# Patient Record
Sex: Female | Born: 1990 | Race: White | Hispanic: No | Marital: Married | Smoking: Never smoker
Health system: Southern US, Community
[De-identification: ages and names within clinical notes are randomized; demographics above are authoritative.]

## PROBLEM LIST (undated history)

## (undated) ENCOUNTER — Inpatient Hospital Stay (HOSPITAL_COMMUNITY): Payer: Self-pay

## (undated) DIAGNOSIS — Z8759 Personal history of other complications of pregnancy, childbirth and the puerperium: Secondary | ICD-10-CM

## (undated) DIAGNOSIS — D649 Anemia, unspecified: Secondary | ICD-10-CM

## (undated) DIAGNOSIS — E282 Polycystic ovarian syndrome: Secondary | ICD-10-CM

## (undated) DIAGNOSIS — T7840XA Allergy, unspecified, initial encounter: Secondary | ICD-10-CM

## (undated) DIAGNOSIS — J45909 Unspecified asthma, uncomplicated: Secondary | ICD-10-CM

## (undated) DIAGNOSIS — R87629 Unspecified abnormal cytological findings in specimens from vagina: Secondary | ICD-10-CM

## (undated) DIAGNOSIS — K219 Gastro-esophageal reflux disease without esophagitis: Secondary | ICD-10-CM

## (undated) DIAGNOSIS — Z8619 Personal history of other infectious and parasitic diseases: Secondary | ICD-10-CM

## (undated) HISTORY — PX: EYE SURGERY: SHX253

## (undated) HISTORY — DX: Unspecified abnormal cytological findings in specimens from vagina: R87.629

## (undated) HISTORY — DX: Personal history of other complications of pregnancy, childbirth and the puerperium: Z87.59

## (undated) HISTORY — DX: Personal history of other infectious and parasitic diseases: Z86.19

## (undated) HISTORY — DX: Allergy, unspecified, initial encounter: T78.40XA

## (undated) HISTORY — DX: Gastro-esophageal reflux disease without esophagitis: K21.9

## (undated) HISTORY — PX: HYMENECTOMY: SHX987

## (undated) HISTORY — PX: CYSTECTOMY: SHX5119

## (undated) HISTORY — PX: SKIN TAG REMOVAL: SHX780

## (undated) HISTORY — DX: Unspecified asthma, uncomplicated: J45.909

## (undated) HISTORY — DX: Anemia, unspecified: D64.9

---
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
## 2009-08-13 ENCOUNTER — Ambulatory Visit: Payer: Self-pay | Admitting: Internal Medicine

## 2009-08-13 DIAGNOSIS — R030 Elevated blood-pressure reading, without diagnosis of hypertension: Secondary | ICD-10-CM

## 2009-08-13 DIAGNOSIS — R040 Epistaxis: Secondary | ICD-10-CM | POA: Insufficient documentation

## 2009-10-03 ENCOUNTER — Ambulatory Visit: Payer: Self-pay | Admitting: Family Medicine

## 2009-10-03 DIAGNOSIS — J069 Acute upper respiratory infection, unspecified: Secondary | ICD-10-CM | POA: Insufficient documentation

## 2009-10-03 LAB — CONVERTED CEMR LAB: Rapid Strep: NEGATIVE

## 2010-07-27 NOTE — Assessment & Plan Note (Signed)
Summary: elevated BP- jr   Vital Signs:  Patient profile:   20 year old female Height:      69 inches Weight:      234.50 pounds BMI:     34.75 O2 Sat:      97 % on Room air Temp:     98.2 degrees F Pulse rate:   83 / minute Pulse rhythm:   regular Resp:     18 per minute BP sitting:   100 / 64  (right arm) Cuff size:   large  Vitals Entered By: Glendell Docker CMA (August 13, 2009 1:05 PM)  O2 Flow:  Room air  Primary Care Provider:  Georgina Quint Plotnikov MD  CC:  Blood Pressure elevated.  History of Present Illness: 20 y/o white female with hx of asthma, obesity reports elevated blood pressure on Monday, one reading at [facility] 152/92  c/o headaches not sure if related to BP but right nose bleed  denies otc cold prep use / decongestants more stress at home /work  wt gain since last OV she eats one big meal per day   Preventive Screening-Counseling & Management  Alcohol-Tobacco     Smoking Status: never  Allergies (verified): No Known Drug Allergies  Past History:  Past Medical History: Asthma since age 48 Allergic rhinitis Obesity  Past Surgical History: Cyst removed from right elbow 2006 Hyman repair -  2008 Eye surgery for amblyopia - age 30    Family History: Family History Diabetes 1st degree relative Mother has history of Grave's, ADD   Social History: Research scientist (life sciences) at [employer]  Mother's boyfriend smokes Never Smoked Alcohol use-yes  Physical Exam  General:  alert, well-developed, and well-nourished.   Ears:  R ear normal and L ear normal.   Nose:  right nares -  Lungs:  normal respiratory effort and normal breath sounds.   Heart:  normal rate, regular rhythm, and no gallop.     Impression & Recommendations:  Problem # 1:  ELEVATED BLOOD PRESSURE WITHOUT DIAGNOSIS OF HYPERTENSION (ICD-796.2)  20 y/o took her BP at [facility] w automated cuff 152/92.  more stress at home / work.    bp today is normal.  pt to monitor BP at  home.  I stressed wt loss and low salt diet.  return to office if BP trends higher   BP today: 100/64 Prior BP: 130/78 (10/03/2007)  Labs Reviewed: Creat: 0.7 (08/24/2007) Chol: 172 (08/24/2007)   HDL: 43.0 (08/24/2007)   LDL: 109 (08/24/2007)   TG: 99 (08/24/2007)  Instructed in low sodium diet (DASH Handout) and behavior modification.    Problem # 2:  EPISTAXIS (ICD-784.7) right nose bleed.  right nare with evidence of recent bleeding.  use ayr gel and humidifier.  no aspirin or NSAID use I doubt nose bleed re:  transient BP elevation  Complete Medication List: 1)  Qvar 80 Mcg/act Aers (Beclomethasone dipropionate) .... 2 puffs bid 2)  Proair Hfa 108 (90 Base) Mcg/act Aers (Albuterol sulfate) .... 2 puffs q 4-6 hrs as needed 3)  Prilosec 20 Mg Cpdr (Omeprazole) .... One by mouth ac pm meal 4)  Loratadine 10 Mg Tabs (Loratadine) .Marland Kitchen.. 1 once daily as needed allergies  Patient Instructions: 1)  Monitor BP at home with large cuff 2)  Follow low salt diet. 3)  http://[email or web address]-calorie-counter.com/ 4)  Please schedule a follow-up appointment in 1 year. 5)  Please schedule a follow-up appointment as needed. 6)  Use Ayr gel as directed.  Current  Allergies (reviewed today): No known allergies    Preventive Care Screening  Pap Smear:    Date:  12/23/2008    Results:  normal    Preventive Care Screening  Pap Smear:    Date:  12/23/2008    Results:  normal

## 2010-07-27 NOTE — Assessment & Plan Note (Signed)
Summary: URI, early ROM   Vital Signs:  Patient profile:   20 year old female Height:      69 inches Weight:      233 pounds BMI:     34.53 O2 Sat:      96 % on Room air Temp:     100.4 degrees F oral Pulse rate:   96 / minute BP sitting:   104 / 70  (left arm) Cuff size:   large  Vitals Entered By: Payton Spark CMA (October 03, 2009 9:29 AM)  O2 Flow:  Room air CC: ST, earache, nasal congestion and cough x 2 days.    Primary Care Provider:  Georgina Quint Plotnikov MD  CC:  ST, earache, and nasal congestion and cough x 2 days. Marland Kitchen  History of Present Illness: ST, earache, nasal congestion and cough x 2 days. Low grade fever at home and her.  cough is dry and started this AM.  Taking a night time cold medicine.  No GI sxs.  no known sick contacts.  Right ear is hurting.  Green nasal mucous.  hx of asthma.  Says was told had fluid on her right ear about 3 weeks ago but was not having pain at that time.  Allergies: No Known Drug Allergies  Physical Exam  General:  Well-developed,well-nourished,in no acute distress; alert,appropriate and cooperative throughout examination Head:  Normocephalic and atraumatic without obvious abnormalities. No apparent alopecia or balding. Eyes:  No corneal or conjunctival inflammation noted. EOMI. Perrla.  Ears:  External ear exam shows no significant lesions or deformities.  LEft canal and TM are clear.  Right Tm is dull, erythematous and opaque. No discharge or fluid.  Nose:  External nasal examination shows no deformity or inflammation. Mouth:  Oral mucosa and oropharynx without lesions or exudates.  Teeth in good repair. Tongue is peirced.  Neck:  No deformities, masses, or tenderness noted. Lungs:  Normal respiratory effort, chest expands symmetrically. Lungs are clear to auscultation, no crackles or wheezes. Heart:  Normal rate and regular rhythm. S1 and S2 normal without gallop, murmur, click, rub or other extra sounds. Skin:  no rashes.     Cervical Nodes:  No lymphadenopathy noted Psych:  Cognition and judgment appear intact. Alert and cooperative with normal attention span and concentration. No apparent delusions, illusions, hallucinations   Impression & Recommendations:  Problem # 1:  URI (ICD-465.9)  Explained that she has a viral illness. Strep is negative. Also with her hx ofasthma/ allergies discussed the need to have an inhaler readily available. She says she is currently out of her rescue inhaler.  Her Right TM does look inflammed like an early OM. Reviewed wiht patient that if her ear pain worsens or fever persists to go ahead and fill rx for the augmentin. She preferred liquid.  If ear pain resolves then don't fill and expect 1-2 weeks for sxs to resolve. Pt agreed to care plan.  Her updated medication list for this problem includes:    Loratadine 10 Mg Tabs (Loratadine) .Marland Kitchen... 1 once daily as needed allergies  Orders: Rapid Strep (09323)  Problem # 2:  ALLERGIC RHINITIS (ICD-477.9) Encouraged her to strart an antihistamin as she reports that her ashtma and allergies do usually flare this time of year.  Reviewd the meds that are OTC and sent new rx for her inhaler.  Her updated medication list for this problem includes:    Loratadine 10 Mg Tabs (Loratadine) .Marland Kitchen... 1 once daily as needed  allergies  Complete Medication List: 1)  Qvar 80 Mcg/act Aers (Beclomethasone dipropionate) .... 2 puffs bid 2)  Proair Hfa 108 (90 Base) Mcg/act Aers (Albuterol sulfate) .... 2 puffs q 4-6 hrs as needed 3)  Prilosec 20 Mg Cpdr (Omeprazole) .... One by mouth ac pm meal 4)  Loratadine 10 Mg Tabs (Loratadine) .Marland Kitchen.. 1 once daily as needed allergies 5)  Augmentin 250-62.5 Mg/33ml Susr (Amoxicillin-pot clavulanate) .Marland Kitchen.. 10 ml by mouth two times a day for 10 days Prescriptions: AUGMENTIN 250-62.5 MG/5ML SUSR (AMOXICILLIN-POT CLAVULANATE) 10 ml by mouth two times a day for 10 days  #200 x 0   Entered and Authorized by:   Nani Gasser  MD   Signed by:   Nani Gasser MD on 10/03/2009   Method used:   Print then Give to Patient   RxID:   [id number] PROAIR HFA 108 (90 BASE) MCG/ACT  AERS (ALBUTEROL SULFATE) 2 puffs q 4-6 hrs as needed  #1 x 1   Entered and Authorized by:   Nani Gasser MD   Signed by:   Nani Gasser MD on 10/03/2009   Method used:   Electronically to        [facility]  [address] [id number] ([facility])       [address]       [place], [place]  [address]       Ph: [phone] or [phone]       Fax: [phone]   RxID:   [id number]   Laboratory Results    Other Tests  Rapid Strep: negative

## 2010-10-24 ENCOUNTER — Emergency Department (HOSPITAL_COMMUNITY)
Admission: EM | Admit: 2010-10-24 | Discharge: 2010-10-24 | Disposition: A | Discharge status: Home / Self Care | Payer: 59 | Attending: Emergency Medicine | Admitting: Emergency Medicine

## 2010-10-24 DIAGNOSIS — O99891 Other specified diseases and conditions complicating pregnancy: Secondary | ICD-10-CM | POA: Insufficient documentation

## 2010-10-24 DIAGNOSIS — X001XXA Exposure to smoke in uncontrolled fire in building or structure, initial encounter: Secondary | ICD-10-CM | POA: Insufficient documentation

## 2010-10-24 DIAGNOSIS — J705 Respiratory conditions due to smoke inhalation: Secondary | ICD-10-CM | POA: Insufficient documentation

## 2010-10-24 LAB — CARBOXYHEMOGLOBIN
Methemoglobin: 1.5 % (ref 0.0–1.5)
Total hemoglobin: 10.3 g/dL — ABNORMAL LOW (ref 12.5–16.0)

## 2010-11-12 NOTE — Op Note (Signed)
NAMESEDALIA, GREESON                 ACCOUNT NO.:  [id number]   MEDICAL RECORD NO.:  [id number]          PATIENT TYPE:  AMB   LOCATION:  SDC                           FACILITY:  [facility]   PHYSICIAN:  Michelle L. Grewal, M.D.DATE OF BIRTH:  1990-11-18   DATE OF PROCEDURE:  02/01/2005  DATE OF DISCHARGE:                                 OPERATIVE REPORT   PREOPERATIVE DIAGNOSIS:  Vaginal skin tags.   POSTOPERATIVE DIAGNOSIS:  Vaginal skin tags.   OPERATION/PROCEDURE:  Removal of vaginal skin tags.   SURGEON:  Michelle L. Vincente Poli, M.D.   ANESTHESIA:  General.   ESTIMATED BLOOD LOSS:  Minimal.   DESCRIPTION OF PROCEDURE:  The patient was taken to the operating room and  intubated without difficulty.  She was placed in the lithotomy position.  The vaginal opening is prepped and draped in the usual sterile fashion.  The  EUA revealed an intact hymenal ring and approximately 1.5 cm vaginal skin  tag, actually emanating from the 12 o'clock position on the hymen.  Local  was infiltrated at the base of the tag and it was removed easily with  scissors.  One single stitch using 4-0 Vicryl was used for hemostasis.  No  bleeding was noted.  The patient was extubated and returned to the recovery  room in stable condition.  All sponge, lap and instrument counts x2.       MLG/MEDQ  D:  02/01/2005  T:  02/01/2005  Job:  [id number]

## 2010-11-26 ENCOUNTER — Inpatient Hospital Stay (HOSPITAL_COMMUNITY)
Admission: AD | Admit: 2010-11-26 | Payer: Medicaid Other | Source: Ambulatory Visit | Admitting: Obstetrics and Gynecology

## 2010-12-03 ENCOUNTER — Inpatient Hospital Stay (HOSPITAL_COMMUNITY)
Admission: RE | Admit: 2010-12-03 | Discharge: 2010-12-05 | DRG: 775 | Disposition: A | Discharge status: Home / Self Care | Payer: 59 | Source: Ambulatory Visit | Attending: Obstetrics and Gynecology | Admitting: Obstetrics and Gynecology

## 2010-12-03 DIAGNOSIS — O139 Gestational [pregnancy-induced] hypertension without significant proteinuria, unspecified trimester: Principal | ICD-10-CM | POA: Diagnosis present

## 2010-12-03 LAB — CBC
Hemoglobin: 9.6 g/dL — ABNORMAL LOW (ref 12.0–15.0)
MCH: 25.6 pg — ABNORMAL LOW (ref 26.0–34.0)
MCV: 82.1 fL (ref 78.0–100.0)
RBC: 3.75 MIL/uL — ABNORMAL LOW (ref 3.87–5.11)

## 2010-12-03 LAB — COMPREHENSIVE METABOLIC PANEL
ALT: 8 U/L (ref 0–35)
CO2: 17 mEq/L — ABNORMAL LOW (ref 19–32)
Calcium: 9 mg/dL (ref 8.4–10.5)
Creatinine, Ser: 0.77 mg/dL (ref 0.4–1.2)
GFR calc Af Amer: >60 mL/min (ref >60)
GFR calc non Af Amer: >60 mL/min (ref >60)
Glucose, Bld: 106 mg/dL — ABNORMAL HIGH (ref 70–99)

## 2010-12-03 LAB — RPR: RPR Ser Ql: NONREACTIVE

## 2010-12-04 LAB — CBC
MCHC: 31.7 g/dL (ref 30.0–36.0)
MCV: 81.1 fL (ref 78.0–100.0)
Platelets: 194 10*3/uL (ref 150–400)
RDW: 15.7 % — ABNORMAL HIGH (ref 11.5–15.5)
WBC: 14 10*3/uL — ABNORMAL HIGH (ref 4.0–10.5)

## 2010-12-24 NOTE — Op Note (Signed)
  [name], Kelly Ellison                 ACCOUNT NO.:  [id number]  MEDICAL RECORD NO.:  [id number]  LOCATION:  9108                          FACILITY:  [facility]  PHYSICIAN:  Guy Sandifer. Henderson Cloud, M.D. DATE OF BIRTH:  08/18/1990  DATE OF PROCEDURE:  12/03/2010 DATE OF DISCHARGE:                              OPERATIVE REPORT   PROCEDURE:  Vaginal delivery.  PROCEDURE:  This patient is a 20 year old, G1, P0 with an EDC of December 03, 2010, who is admitted with elevated blood pressures.  She has no central nervous system changes, no epigastric pain.  Laboratories are normal. She progresses to complete and pushing.  She has approximately a 30- minute second stage.  There is spontaneous delivery of the vertex. There is a mild shoulder dystocia lasting less than 1 minute.  A second- degree midline episiotomy that had already been performed.  The McRoberts position and suprapubic pressure were used.  With only a moderate traction, the anterior shoulder was delivered without difficulty.  The remainder of the baby delivers without difficulty. Viable female infant, Apgars of 9 and nine at 1 and five minutes respectively was obtained.  Birth weight was 10 pounds 1 ounce. Arterial cord pH is pending.  Placenta is 3-vessels intact.  Estimated blood loss is 500 mL.  The second-degree midline episiotomy is repaired with 2-0 repeat.  Rectum was checked and okay.  Cervix and vagina are intact.  The patient and infant are stable in the labor and delivery room.     Guy Sandifer Henderson Cloud, M.D.     JET/MEDQ  D:  12/03/2010  T:  12/04/2010  Job:  [id number]  Electronically Signed by Harold Hedge M.D. on 12/24/2010 09:14:44 AM

## 2011-01-03 LAB — HM PAP SMEAR: HM Pap smear: NORMAL

## 2011-03-30 LAB — POCT URINALYSIS DIP (DEVICE)
Ketones, ur: NEGATIVE
Protein, ur: 100 — AB
Specific Gravity, Urine: 1.02
pH: 7

## 2011-12-22 ENCOUNTER — Encounter: Payer: Self-pay | Admitting: Internal Medicine

## 2011-12-22 ENCOUNTER — Ambulatory Visit (INDEPENDENT_AMBULATORY_CARE_PROVIDER_SITE_OTHER): Payer: 59 | Admitting: Internal Medicine

## 2011-12-22 VITALS — BP 100/74 | HR 68 | Temp 97.9°F | Resp 18 | Ht 67.5 in | Wt 266.0 lb

## 2011-12-22 DIAGNOSIS — B001 Herpesviral vesicular dermatitis: Secondary | ICD-10-CM | POA: Insufficient documentation

## 2011-12-22 DIAGNOSIS — B009 Herpesviral infection, unspecified: Secondary | ICD-10-CM

## 2011-12-22 MED ORDER — VALACYCLOVIR HCL 1 G PO TABS: ORAL_TABLET | ORAL | Status: AC

## 2011-12-22 NOTE — Progress Notes (Signed)
  Subjective:    Patient ID: Kelly Ellison, female    DOB: [birth year], 21 y.o.   MRN: [id number]  HPI Pt presents to clinic for evaluation of fever blisters. Notes 5d h/o two cold sores located right and right of midline lower lip. Has crusting and some discomfort worse with eating. Attempted otc cream without improvement. No other alleviating or exacerbating factors.   Past Medical History  Diagnosis Date  . Asthma     inhaler prn  . History of chicken pox     age 64   Past Surgical History  Procedure Date  . Cystectomy     left elbow  . Eye surgery     young age  . Skin tag removal     vaginal    reports that she has never smoked. She has never used smokeless tobacco. She reports that she does not drink alcohol or use illicit drugs. family history includes Arthritis in her mother and Diabetes in her maternal grandmother.  There is no history of Breast cancer, and Prostate cancer, and Colon cancer, and Heart disease, and Hypertension, . No Known Allergies   Review of Systems see hpi     Objective:   Physical Exam  Nursing note and vitals reviewed. Constitutional: She appears well-developed and well-nourished. No distress.  HENT:  Head: Normocephalic and atraumatic.  Right Ear: External ear normal.  Left Ear: External ear normal.  Eyes: Conjunctivae are normal. No scleral icterus.  Neck: Neck supple.  Lymphadenopathy:    She has no cervical adenopathy.  Skin: Skin is warm and dry. She is not diaphoretic.       Right lower lip with yellow crusting lesion. OP and mucosal membranes clear.  Psychiatric: She has a normal mood and affect.          Assessment & Plan:

## 2011-12-22 NOTE — Assessment & Plan Note (Signed)
Attempt valtrex prn. Followup if no improvement or worsening.

## 2012-02-28 ENCOUNTER — Other Ambulatory Visit: Payer: Self-pay | Admitting: Obstetrics and Gynecology

## 2013-04-10 ENCOUNTER — Other Ambulatory Visit: Payer: Self-pay | Admitting: Obstetrics and Gynecology

## 2014-05-16 ENCOUNTER — Other Ambulatory Visit: Payer: Self-pay | Admitting: Obstetrics and Gynecology

## 2014-05-20 LAB — CYTOLOGY - PAP

## 2014-10-06 ENCOUNTER — Encounter (HOSPITAL_COMMUNITY): Payer: Self-pay | Admitting: Emergency Medicine

## 2014-10-06 ENCOUNTER — Emergency Department (HOSPITAL_COMMUNITY)
Admission: EM | Admit: 2014-10-06 | Discharge: 2014-10-06 | Disposition: A | Discharge status: Home / Self Care | Payer: 59 | Attending: Emergency Medicine | Admitting: Emergency Medicine

## 2014-10-06 DIAGNOSIS — Z8619 Personal history of other infectious and parasitic diseases: Secondary | ICD-10-CM | POA: Insufficient documentation

## 2014-10-06 DIAGNOSIS — Z79899 Other long term (current) drug therapy: Secondary | ICD-10-CM | POA: Diagnosis not present

## 2014-10-06 DIAGNOSIS — J302 Other seasonal allergic rhinitis: Secondary | ICD-10-CM

## 2014-10-06 DIAGNOSIS — J45901 Unspecified asthma with (acute) exacerbation: Secondary | ICD-10-CM | POA: Insufficient documentation

## 2014-10-06 DIAGNOSIS — J45909 Unspecified asthma, uncomplicated: Secondary | ICD-10-CM | POA: Diagnosis present

## 2014-10-06 MED ORDER — PREDNISONE 10 MG PO TABS: 20.0000 mg | ORAL_TABLET | Freq: Every day | ORAL | Status: DC

## 2014-10-06 MED ORDER — PREDNISONE 20 MG PO TABS
60.0000 mg | ORAL_TABLET | Freq: Once | ORAL | Status: AC
  Administered 2014-10-06: 60 mg via ORAL
  Filled 2014-10-06: qty 3

## 2014-10-06 MED ORDER — IPRATROPIUM-ALBUTEROL 0.5-2.5 (3) MG/3ML IN SOLN
3.0000 mL | Freq: Once | RESPIRATORY_TRACT | Status: AC
  Administered 2014-10-06: 3 mL via RESPIRATORY_TRACT
  Filled 2014-10-06: qty 3

## 2014-10-06 NOTE — ED Notes (Signed)
Pt c/o asthma x 1 week. Has been using inhaler without relief. Mild wheezing noted to L upper lobe

## 2014-10-06 NOTE — ED Provider Notes (Signed)
CSN: [id number]     Arrival date & time 10/06/14  2102 History  This chart was scribed for Marlon Pel, PA-C, working with Blake Divine, MD by Elon Spanner, ED Scribe. This patient was seen in room TR03C/TR03C and the patient's care was started at 9:24 PM.   Chief Complaint  Patient presents with  . Asthma    The patient says her asthma began acting up for about a week. She said she tried her inhaler and it is not working.  She is here to be evaluated.   The history is provided by the patient. No language interpreter was used.   PCP: Letitia Libra, Ala Dach, MD Blood pressure 122/68, pulse 99, temperature 98.2 F (36.8 C), temperature source Oral, resp. rate 16, last menstrual period 10/04/2014, SpO2 100 %.  Kelly Ellison is a 24 y.o.female with a significant PMH of asthma and chicken pox.    HPI Comments: Kelly Ellison is a 24 y.o. female with a history of asthma who presents to the Emergency Department complaining of wheezing with associated cough, throat and chest tightness, and congestion onset 1 week ago with worsening 3 days ago.  She has used her albuterol inhaler with minor, transient relief as well as Mucinex DM, and allegra without relief.  She also notes some mild, intermittent ear pain that resolved 3 days ago.  Patient does not have an at-home nebulizer.  Patient denies sneezing, fever, nausea, vomiting, ear pain, leg swelling, abdominal pain.  Her mom is a Engineer, civil (consulting) here at [facility], they deny that she has had any URI symptoms.  Past Medical History  Diagnosis Date  . Asthma     inhaler prn  . History of chicken pox     age 38   Past Surgical History  Procedure Laterality Date  . Cystectomy      left elbow  . Eye surgery      young age  . Skin tag removal      vaginal   Family History  Problem Relation Age of Onset  . Arthritis Mother     maternal grandmother  . Breast cancer Neg Hx   . Prostate cancer Neg Hx   . Colon cancer Neg Hx   . Heart disease Neg Hx    . Diabetes Maternal Grandmother     brohter  . Hypertension Neg Hx    History  Substance Use Topics  . Smoking status: Never Smoker   . Smokeless tobacco: Never Used  . Alcohol Use: No   OB History    No data available     Review of Systems  Constitutional: Negative for fever.  HENT: Negative for ear pain and sneezing.   Respiratory: Positive for chest tightness and wheezing.   Cardiovascular: Negative for leg swelling.  Gastrointestinal: Negative for nausea, vomiting and abdominal pain.    Allergies  Review of patient's allergies indicates no known allergies.  Home Medications   Prior to Admission medications   Medication Sig Start Date End Date Taking? Authorizing Provider  albuterol (PROAIR HFA) 108 (90 BASE) MCG/ACT inhaler Inhale 2 puffs into the lungs every 6 (six) hours as needed.    Historical Provider, MD  predniSONE (DELTASONE) 10 MG tablet Take 2 tablets (20 mg total) by mouth daily. 10/06/14   Averey Trompeter Neva Seat, PA-C   BP 122/68 mmHg  Pulse 99  Temp(Src) 98.2 F (36.8 C) (Oral)  Resp 16  SpO2 100%  LMP 10/04/2014 Physical Exam  Constitutional: She is oriented  to person, place, and time. She appears well-developed and well-nourished. No distress.  HENT:  Head: Normocephalic and atraumatic.  Eyes: Conjunctivae and EOM are normal.  Neck: Neck supple. No tracheal deviation present.  Cardiovascular: Normal rate.   Pulmonary/Chest: Effort normal. No respiratory distress. She has no decreased breath sounds. She has wheezes (very minimal expiratory wheezes). She has no rhonchi. She has no rales.  Musculoskeletal: Normal range of motion.  Neurological: She is alert and oriented to person, place, and time.  Skin: Skin is warm and dry.  Psychiatric: She has a normal mood and affect. Her behavior is normal.  Nursing note and vitals reviewed.   ED Course  Procedures (including critical care time)  DIAGNOSTIC STUDIES: Oxygen Saturation is 98% on RA, normal by my  interpretation.    COORDINATION OF CARE:  9:29 PM Discussed treatment plan with patient at bedside.  Patient acknowledges and agrees with plan.    Labs Review Labs Reviewed - No data to display  Imaging Review No results found.   EKG Interpretation None      MDM   Final diagnoses:  Asthma exacerbation  Seasonal allergies    Medications  ipratropium-albuterol (DUONEB) 0.5-2.5 (3) MG/3ML nebulizer solution 3 mL (3 mLs Nebulization Given 10/06/14 2121)  predniSONE (DELTASONE) tablet 60 mg (60 mg Oral Given 10/06/14 2136)  ipratropium-albuterol (DUONEB) 0.5-2.5 (3) MG/3ML nebulizer solution 3 mL (3 mLs Nebulization Given 10/06/14 2203)   The patient is to be started on Prednisone for albuterol resistant asthma exacerbation. Her symptoms are very mild and she is sating on oxygen at 100% on room air She is not tachycardic or retracting. Pt will appearing.  24 y.o.Kelly Ellison's evaluation in the Emergency Department is complete. It has been determined that no acute conditions requiring further emergency intervention are present at this time. The patient/guardian have been advised of the diagnosis and plan. We have discussed signs and symptoms that warrant return to the ED, such as changes or worsening in symptoms.  Vital signs are stable at discharge. Filed Vitals:   10/06/14 2220  BP: 122/68  Pulse: 99  Temp:   Resp: 16    Patient/guardian has voiced understanding and agreed to follow-up with the PCP or specialist.   I personally performed the services described in this documentation, which was scribed in my presence. The recorded information has been reviewed and is accurate.    Marlon Peliffany Makaylin Carlo, PA-C 10/07/14 1402  Blake DivineJohn Wofford, MD 10/07/14 [phone]

## 2014-10-06 NOTE — Discharge Instructions (Signed)
Bronchospasm A bronchospasm is a spasm or tightening of the airways going into the lungs. During a bronchospasm breathing becomes more difficult because the airways get smaller. When this happens there can be coughing, a whistling sound when breathing (wheezing), and difficulty breathing. Bronchospasm is often associated with asthma, but not all patients who experience a bronchospasm have asthma. CAUSES  A bronchospasm is caused by inflammation or irritation of the airways. The inflammation or irritation may be triggered by:   Allergies (such as to animals, pollen, food, or mold). Allergens that cause bronchospasm may cause wheezing immediately after exposure or many hours later.   Infection. Viral infections are believed to be the most common cause of bronchospasm.   Exercise.   Irritants (such as pollution, cigarette smoke, strong odors, aerosol sprays, and paint fumes).   Weather changes. Winds increase molds and pollens in the air. Rain refreshes the air by washing irritants out. Cold air may cause inflammation.   Stress and emotional upset.  SIGNS AND SYMPTOMS   Wheezing.   Excessive nighttime coughing.   Frequent or severe coughing with a simple cold.   Chest tightness.   Shortness of breath.  DIAGNOSIS  Bronchospasm is usually diagnosed through a history and physical exam. Tests, such as chest X-rays, are sometimes done to look for other conditions. TREATMENT   Inhaled medicines can be given to open up your airways and help you breathe. The medicines can be given using either an inhaler or a nebulizer machine.  Corticosteroid medicines may be given for severe bronchospasm, usually when it is associated with asthma. HOME CARE INSTRUCTIONS   Always have a plan prepared for seeking medical care. Know when to call your health care provider and local emergency services (911 in the [place] Know where you can access local emergency care.  Only take medicines as  directed by your health care provider.  If you were prescribed an inhaler or nebulizer machine, ask your health care provider to explain how to use it correctly. Always use a spacer with your inhaler if you were given one.  It is necessary to remain calm during an attack. Try to relax and breathe more slowly.  Control your home environment in the following ways:   Change your heating and air conditioning filter at least once a month.   Limit your use of fireplaces and wood stoves.  Do not smoke and do not allow smoking in your home.   Avoid exposure to perfumes and fragrances.   Get rid of pests (such as roaches and mice) and their droppings.   Throw away plants if you see mold on them.   Keep your house clean and dust free.   Replace carpet with wood, tile, or vinyl flooring. Carpet can trap dander and dust.   Use allergy-proof pillows, mattress covers, and box spring covers.   Wash bed sheets and blankets every week in hot water and dry them in a dryer.   Use blankets that are made of polyester or cotton.   Wash hands frequently. SEEK MEDICAL CARE IF:   You have muscle aches.   You have chest pain.   The sputum changes from clear or white to yellow, green, gray, or bloody.   The sputum you cough up gets thicker.   There are problems that may be related to the medicine you are given, such as a rash, itching, swelling, or trouble breathing.  SEEK IMMEDIATE MEDICAL CARE IF:   You have worsening wheezing and coughing even  after taking your prescribed medicines.   You have increased difficulty breathing.   You develop severe chest pain. MAKE SURE YOU:   Understand these instructions.  Will watch your condition.  Will get help right away if you are not doing well or get worse. Document Released: 06/16/2003 Document Revised: 06/18/2013 Document Reviewed: 12/03/2012 [facility] Patient Information 2015 [place], [place]. This information is not  intended to replace advice given to you by your health care provider. Make sure you discuss any questions you have with your health care provider.  Asthma Asthma is a recurring condition in which the airways tighten and narrow. Asthma can make it difficult to breathe. It can cause coughing, wheezing, and shortness of breath. Asthma episodes, also called asthma attacks, range from minor to life-threatening. Asthma cannot be cured, but medicines and lifestyle changes can help control it. CAUSES Asthma is believed to be caused by inherited (genetic) and environmental factors, but its exact cause is unknown. Asthma may be triggered by allergens, lung infections, or irritants in the air. Asthma triggers are different for each person. Common triggers include:   Animal dander.  Dust mites.  Cockroaches.  Pollen from trees or grass.  Mold.  Smoke.  Air pollutants such as dust, household cleaners, hair sprays, aerosol sprays, paint fumes, strong chemicals, or strong odors.  Cold air, weather changes, and winds (which increase molds and pollens in the air).  Strong emotional expressions such as crying or laughing hard.  Stress.  Certain medicines (such as aspirin) or types of drugs (such as beta-blockers).  Sulfites in foods and drinks. Foods and drinks that may contain sulfites include dried fruit, potato chips, and sparkling grape juice.  Infections or inflammatory conditions such as the flu, a cold, or an inflammation of the nasal membranes (rhinitis).  Gastroesophageal reflux disease (GERD).  Exercise or strenuous activity. SYMPTOMS Symptoms may occur immediately after asthma is triggered or many hours later. Symptoms include:  Wheezing.  Excessive nighttime or early morning coughing.  Frequent or severe coughing with a common cold.  Chest tightness.  Shortness of breath. DIAGNOSIS  The diagnosis of asthma is made by a review of your medical history and a physical exam.  Tests may also be performed. These may include:  Lung function studies. These tests show how much air you breathe in and out.  Allergy tests.  Imaging tests such as X-rays. TREATMENT  Asthma cannot be cured, but it can usually be controlled. Treatment involves identifying and avoiding your asthma triggers. It also involves medicines. There are 2 classes of medicine used for asthma treatment:   Controller medicines. These prevent asthma symptoms from occurring. They are usually taken every day.  Reliever or rescue medicines. These quickly relieve asthma symptoms. They are used as needed and provide short-term relief. Your health care provider will help you create an asthma action plan. An asthma action plan is a written plan for managing and treating your asthma attacks. It includes a list of your asthma triggers and how they may be avoided. It also includes information on when medicines should be taken and when their dosage should be changed. An action plan may also involve the use of a device called a peak flow meter. A peak flow meter measures how well the lungs are working. It helps you monitor your condition. HOME CARE INSTRUCTIONS   Take medicines only as directed by your health care provider. Speak with your health care provider if you have questions about how or when to take  the medicines.  Use a peak flow meter as directed by your health care provider. Record and keep track of readings.  Understand and use the action plan to help minimize or stop an asthma attack without needing to seek medical care.  Control your home environment in the following ways to help prevent asthma attacks:  Do not smoke. Avoid being exposed to secondhand smoke.  Change your heating and air conditioning filter regularly.  Limit your use of fireplaces and wood stoves.  Get rid of pests (such as roaches and mice) and their droppings.  Throw away plants if you see mold on them.  Clean your floors and  dust regularly. Use unscented cleaning products.  Try to have someone else vacuum for you regularly. Stay out of rooms while they are being vacuumed and for a short while afterward. If you vacuum, use a dust mask from a hardware store, a double-layered or microfilter vacuum cleaner bag, or a vacuum cleaner with a HEPA filter.  Replace carpet with wood, tile, or vinyl flooring. Carpet can trap dander and dust.  Use allergy-proof pillows, mattress covers, and box spring covers.  Wash bed sheets and blankets every week in hot water and dry them in a dryer.  Use blankets that are made of polyester or cotton.  Clean bathrooms and kitchens with bleach. If possible, have someone repaint the walls in these rooms with mold-resistant paint. Keep out of the rooms that are being cleaned and painted.  Wash hands frequently. SEEK MEDICAL CARE IF:   You have wheezing, shortness of breath, or a cough even if taking medicine to prevent attacks.  The colored mucus you cough up (sputum) is thicker than usual.  Your sputum changes from clear or white to yellow, green, gray, or bloody.  You have any problems that may be related to the medicines you are taking (such as a rash, itching, swelling, or trouble breathing).  You are using a reliever medicine more than 2-3 times per week.  Your peak flow is still at 50-79% of your personal best after following your action plan for 1 hour.  You have a fever. SEEK IMMEDIATE MEDICAL CARE IF:   You seem to be getting worse and are unresponsive to treatment during an asthma attack.  You are short of breath even at rest.  You get short of breath when doing very little physical activity.  You have difficulty eating, drinking, or talking due to asthma symptoms.  You develop chest pain.  You develop a fast heartbeat.  You have a bluish color to your lips or fingernails.  You are light-headed, dizzy, or faint.  Your peak flow is less than 50% of your  personal best. MAKE SURE YOU:   Understand these instructions.  Will watch your condition.  Will get help right away if you are not doing well or get worse. Document Released: 06/13/2005 Document Revised: 10/28/2013 Document Reviewed: 01/10/2013 [facility] Patient Information 2015 [place], [place]. This information is not intended to replace advice given to you by your health care provider. Make sure you discuss any questions you have with your health care provider.  Allergies Allergies may happen from anything your body is sensitive to. This may be food, medicines, pollens, chemicals, and nearly anything around you in everyday life that produces allergens. An allergen is anything that causes an allergy producing substance. Heredity is often a factor in causing these problems. This means you may have some of the same allergies as your parents. Food allergies  happen in all age groups. Food allergies are some of the most severe and life threatening. Some common food allergies are cow's milk, seafood, eggs, nuts, wheat, and soybeans. SYMPTOMS   Swelling around the mouth.  An itchy red rash or hives.  Vomiting or diarrhea.  Difficulty breathing. SEVERE ALLERGIC REACTIONS ARE LIFE-THREATENING. This reaction is called anaphylaxis. It can cause the mouth and throat to swell and cause difficulty with breathing and swallowing. In severe reactions only a trace amount of food (for example, peanut oil in a salad) may cause death within seconds. Seasonal allergies occur in all age groups. These are seasonal because they usually occur during the same season every year. They may be a reaction to molds, grass pollens, or tree pollens. Other causes of problems are house dust mite allergens, pet dander, and mold spores. The symptoms often consist of nasal congestion, a runny itchy nose associated with sneezing, and tearing itchy eyes. There is often an associated itching of the mouth and ears. The problems  happen when you come in contact with pollens and other allergens. Allergens are the particles in the air that the body reacts to with an allergic reaction. This causes you to release allergic antibodies. Through a chain of events, these eventually cause you to release histamine into the blood stream. Although it is meant to be protective to the body, it is this release that causes your discomfort. This is why you were given anti-histamines to feel better. If you are unable to pinpoint the offending allergen, it may be determined by skin or blood testing. Allergies cannot be cured but can be controlled with medicine. Hay fever is a collection of all or some of the seasonal allergy problems. It may often be treated with simple over-the-counter medicine such as diphenhydramine. Take medicine as directed. Do not drink alcohol or drive while taking this medicine. Check with your caregiver or package insert for child dosages. If these medicines are not effective, there are many new medicines your caregiver can prescribe. Stronger medicine such as nasal spray, eye drops, and corticosteroids may be used if the first things you try do not work well. Other treatments such as immunotherapy or desensitizing injections can be used if all else fails. Follow up with your caregiver if problems continue. These seasonal allergies are usually not life threatening. They are generally more of a nuisance that can often be handled using medicine. HOME CARE INSTRUCTIONS   If unsure what causes a reaction, keep a diary of foods eaten and symptoms that follow. Avoid foods that cause reactions.  If hives or rash are present:  Take medicine as directed.  You may use an over-the-counter antihistamine (diphenhydramine) for hives and itching as needed.  Apply cold compresses (cloths) to the skin or take baths in cool water. Avoid hot baths or showers. Heat will make a rash and itching worse.  If you are severely  allergic:  Following a treatment for a severe reaction, hospitalization is often required for closer follow-up.  Wear a medic-alert bracelet or necklace stating the allergy.  You and your family must learn how to give adrenaline or use an anaphylaxis kit.  If you have had a severe reaction, always carry your anaphylaxis kit or EpiPen with you. Use this medicine as directed by your caregiver if a severe reaction is occurring. Failure to do so could have a fatal outcome. SEEK MEDICAL CARE IF:  You suspect a food allergy. Symptoms generally happen within 30 minutes of eating a  food.  Your symptoms have not gone away within 2 days or are getting worse.  You develop new symptoms.  You want to retest yourself or your child with a food or drink you think causes an allergic reaction. Never do this if an anaphylactic reaction to that food or drink has happened before. Only do this under the care of a caregiver. SEEK IMMEDIATE MEDICAL CARE IF:   You have difficulty breathing, are wheezing, or have a tight feeling in your chest or throat.  You have a swollen mouth, or you have hives, swelling, or itching all over your body.  You have had a severe reaction that has responded to your anaphylaxis kit or an EpiPen. These reactions may return when the medicine has worn off. These reactions should be considered life threatening. MAKE SURE YOU:   Understand these instructions.  Will watch your condition.  Will get help right away if you are not doing well or get worse. Document Released: 09/06/2002 Document Revised: 10/08/2012 Document Reviewed: 02/11/2008 [facility] Patient Information 2015 [place], [place]. This information is not intended to replace advice given to you by your health care provider. Make sure you discuss any questions you have with your health care provider.

## 2014-10-06 NOTE — ED Notes (Signed)
The patient says her asthma began acting up for about a week. She said she tried her inhaler and it is not working.  She is here to be evaluated.

## 2014-10-08 ENCOUNTER — Encounter: Payer: Self-pay | Admitting: Medical

## 2014-10-08 ENCOUNTER — Ambulatory Visit (INDEPENDENT_AMBULATORY_CARE_PROVIDER_SITE_OTHER): Payer: 59 | Admitting: Medical

## 2014-10-08 VITALS — BP 122/82 | HR 74 | Temp 98.2°F | Ht 68.75 in | Wt 284.8 lb

## 2014-10-08 DIAGNOSIS — J45901 Unspecified asthma with (acute) exacerbation: Secondary | ICD-10-CM

## 2014-10-08 DIAGNOSIS — J4521 Mild intermittent asthma with (acute) exacerbation: Secondary | ICD-10-CM

## 2014-10-08 DIAGNOSIS — E282 Polycystic ovarian syndrome: Secondary | ICD-10-CM

## 2014-10-08 HISTORY — DX: Unspecified asthma with (acute) exacerbation: J45.901

## 2014-10-08 MED ORDER — ALBUTEROL SULFATE HFA 108 (90 BASE) MCG/ACT IN AERS
2.0000 | INHALATION_SPRAY | Freq: Four times a day (QID) | RESPIRATORY_TRACT | Status: DC | PRN
Start: 2014-10-08 — End: 2022-10-13

## 2014-10-08 MED ORDER — ALBUTEROL SULFATE (2.5 MG/3ML) 0.083% IN NEBU: 2.5000 mg | INHALATION_SOLUTION | Freq: Four times a day (QID) | RESPIRATORY_TRACT | Status: DC | PRN

## 2014-10-08 MED ORDER — BECLOMETHASONE DIPROPIONATE 40 MCG/ACT IN AERS: 2.0000 | INHALATION_SPRAY | Freq: Two times a day (BID) | RESPIRATORY_TRACT | Status: DC

## 2014-10-08 NOTE — Progress Notes (Signed)
Pre visit review using our clinic review tool, if applicable. No additional management support is needed unless otherwise documented below in the visit note. 

## 2014-10-08 NOTE — Assessment & Plan Note (Signed)
Likely allergic rhinitis component since mid spring with asthma flare. So continue allegra and get flonase otc.

## 2014-10-08 NOTE — Assessment & Plan Note (Signed)
On metformin. And on progesterone as well per gyn instructions.Dr. Vincente PoliGrewal at [facility].

## 2014-10-08 NOTE — Assessment & Plan Note (Signed)
Pt flare now at mild level. Worse other day in ED. 2 yrs without any significant flare. Rx of qvar. Continue the taper predninsone. Rx albuterol inhaler. And neb machine with solution. Solution sent to her pharmacy. Neb machine rx sent to [facility].

## 2014-10-08 NOTE — Patient Instructions (Addendum)
ALLERGIC RHINITIS Likely allergic rhinitis component since mid spring with asthma flare. So continue allegra and get flonase otc.   Asthma with acute exacerbation Pt flare now at mild level. Worse other day in ED. 2 yrs without any significant flare. Rx of qvar. Continue the taper predninsone. Rx albuterol inhaler. And neb machine with solution. Solution sent to her pharmacy. Neb machine rx sent to [facility].     Follow up in 3 wks or as needed.

## 2014-10-08 NOTE — Progress Notes (Signed)
Subjective:    Patient ID: Kelly Ellison, female    DOB: 08/09/1990, 24 y.o.   MRN: [id number]  HPI  I have reviewed pt PMH, PSH, FH, Social History and Surgical History  Allergies- seasonal allergies per pt. Thought she does not describe hx. But ED thought recent asthma flare caused by allergies.   Asthma- since age of 24 yo. Last 2 years she had very minimal symptoms. So rare past 2 years only would borrow family members inhaler.  About one week ago got wheezy. She was using family members albuterol every 4 hours or sooner but it was not helping. She was coughing and sob. So went to ED.  Pt has not filled prednisone yet. She has prednsione at home and following tapered dose 60 mg 1 days, 50 mg 1 day, 40 mg 1day, 30 mg 1 day, 20 mg 1 day, and 10 mg 1 day. Pt feels some improved.   Pt was not given antibiotic other day.   LMP- started on Saturday. Still on.  Review of Systems  Constitutional: Negative for fever, chills and fatigue.  HENT: Positive for congestion. Negative for postnasal drip, rhinorrhea, sinus pressure, sneezing, sore throat and trouble swallowing.   Eyes: Negative for redness and itching.  Respiratory: Positive for cough and wheezing. Negative for chest tightness and shortness of breath.        Much better than other day per pt.  Cardiovascular: Negative for chest pain and palpitations.  Musculoskeletal: Negative for back pain.  Neurological: Negative for dizziness and headaches.  Hematological: Negative for adenopathy. Does not bruise/bleed easily.   Past Medical History  Diagnosis Date  . Asthma     inhaler prn  . History of chicken pox     age 89  . Allergy   . Anemia     History   Social History  . Marital Status: Married    Spouse Name: N/A  . Number of Children: N/A  . Years of Education: N/A   Occupational History  . Not on file.   Social History Main Topics  . Smoking status: Never Smoker   . Smokeless tobacco: Never Used  . Alcohol Use:  No  . Drug Use: No  . Sexual Activity: Not on file   Other Topics Concern  . Not on file   Social History Narrative    Past Surgical History  Procedure Laterality Date  . Cystectomy      left elbow  . Eye surgery      young age  . Skin tag removal      vaginal    Family History  Problem Relation Age of Onset  . Arthritis Mother     maternal grandmother  . Breast cancer Neg Hx   . Prostate cancer Neg Hx   . Colon cancer Neg Hx   . Heart disease Neg Hx   . Diabetes Maternal Grandmother     brohter  . Hypertension Neg Hx     No Known Allergies  Current Outpatient Prescriptions on File Prior to Visit  Medication Sig Dispense Refill  . predniSONE (DELTASONE) 10 MG tablet Take 2 tablets (20 mg total) by mouth daily. 21 tablet 0   No current facility-administered medications on file prior to visit.    BP 122/82 mmHg  Pulse 74  Temp(Src) 98.2 F (36.8 C) (Oral)  Ht 5' 8.75" (1.746 m)  Wt 284 lb 12.8 oz (129.184 kg)  BMI 42.38 kg/m2  SpO2 100%  LMP  10/04/2014       Objective:   Physical Exam   General  Mental Status - Alert. General Appearance - Well groomed. Not in acute distress.  Skin Rashes- No Rashes.  HEENT Head- Normal. Ear Auditory Canal - Left- Normal. Right - Normal.Tympanic Membrane- Left- Normal. Right- Normal. Eye Sclera/Conjunctiva- Left- Normal. Right- Normal. Nose & Sinuses Nasal Mucosa- Left-  Boggy and Congested. Right-  Boggy and  Congested.No maxillary or  frontal sinus pressure. Mouth & Throat Lips: Upper Lip- Normal: no dryness, cracking, pallor, cyanosis, or vesicular eruption. Lower Lip-Normal: no dryness, cracking, pallor, cyanosis or vesicular eruption. Buccal Mucosa- Bilateral- No Aphthous ulcers. Oropharynx- No Discharge or Erythema. +pnd Tonsils: Characteristics- Bilateral- No Erythema or Congestion. Size/Enlargement- Bilateral- No enlargement. Discharge- bilateral-None.  Neck Neck- Supple. No Masses. No tracheal  deviation.   Chest and Lung Exam Auscultation: Breath Sounds:-Clear even and unlabored. Looks stable. Cardiovascular Auscultation:Rythm- Regular, rate and rhythm. Murmurs & Other Heart Sounds:Ausculatation of the heart reveal- No Murmurs.  Lymphatic Head & Neck General Head & Neck Lymphatics: Bilateral: Description- No Localized lymphadenopathy.      Assessment & Plan:

## 2015-04-09 ENCOUNTER — Encounter: Payer: Self-pay | Admitting: Medical

## 2015-04-09 ENCOUNTER — Ambulatory Visit (INDEPENDENT_AMBULATORY_CARE_PROVIDER_SITE_OTHER): Payer: 59 | Admitting: Medical

## 2015-04-09 VITALS — BP 122/80 | HR 100 | Temp 97.8°F | Ht 68.75 in | Wt 288.0 lb

## 2015-04-09 DIAGNOSIS — J0101 Acute recurrent maxillary sinusitis: Secondary | ICD-10-CM | POA: Diagnosis not present

## 2015-04-09 DIAGNOSIS — H6502 Acute serous otitis media, left ear: Secondary | ICD-10-CM

## 2015-04-09 MED ORDER — AMOXICILLIN-POT CLAVULANATE 875-125 MG PO TABS: 1.0000 | ORAL_TABLET | Freq: Two times a day (BID) | ORAL | Status: DC

## 2015-04-09 MED ORDER — FLUTICASONE PROPIONATE 50 MCG/ACT NA SUSP: 2.0000 | Freq: Every day | NASAL | Status: DC

## 2015-04-09 MED ORDER — BENZONATATE 100 MG PO CAPS: 100.0000 mg | ORAL_CAPSULE | Freq: Three times a day (TID) | ORAL | Status: DC | PRN

## 2015-04-09 NOTE — Patient Instructions (Signed)
Your appear to have a sinus infection. I am prescribing augmentin antibiotic for the infection. To help with the nasal congestion I prescribed flonase nasal steroid. For your associated cough, I prescribed cough medicine benzonatate  If your wheezing worsen and having to use albuterol frequently then restart qvar.  Then antibiotic for your sinus infection should treat lt OM as well.  Rest, hydrate, tylenol for fever.  Follow up in 7 days or as needed.

## 2015-04-09 NOTE — Progress Notes (Signed)
Subjective:    Patient ID: Kelly KernSarah A Venn, female    DOB: 1991-05-13, 24 y.o.   MRN: [id number]  HPI  Pt in for nasal congestion with severe stuffiness to ear since Monday. Some productive cough and chest congestion. Some frontal sinus pressure.   Pt has not been sneezing much at all. Very rare sneeze. Watery eyes but no itch.  Some sweating at night and now. No known fever. No chills.  Pt husband and son is sick.   LMP- Sept 19-25.   Review of Systems  Constitutional: Positive for diaphoresis. Negative for fever and chills.  HENT: Positive for congestion, ear discharge, sinus pressure and sneezing.        Ear pressure.  Eyes: Negative for photophobia, discharge, redness and itching.  Respiratory: Positive for cough and wheezing.        Just today had to use albuterol only one time.  Cardiovascular: Negative for chest pain and palpitations.  Gastrointestinal: Negative for abdominal pain.  Musculoskeletal: Negative for myalgias and back pain.  Neurological: Negative for dizziness and headaches.  Hematological: Negative for adenopathy. Does not bruise/bleed easily.  Psychiatric/Behavioral: Negative for behavioral problems and confusion.   Past Medical History  Diagnosis Date  . Asthma     inhaler prn  . History of chicken pox     age 89  . Allergy   . Anemia     Social History   Social History  . Marital Status: Married    Spouse Name: N/A  . Number of Children: N/A  . Years of Education: N/A   Occupational History  . Not on file.   Social History Main Topics  . Smoking status: Never Smoker   . Smokeless tobacco: Never Used  . Alcohol Use: No  . Drug Use: No  . Sexual Activity: Not on file   Other Topics Concern  . Not on file   Social History Narrative    Past Surgical History  Procedure Laterality Date  . Cystectomy      left elbow  . Eye surgery      young age  . Skin tag removal      vaginal    Family History  Problem Relation Age of Onset   . Arthritis Mother     maternal grandmother  . Breast cancer Neg Hx   . Prostate cancer Neg Hx   . Colon cancer Neg Hx   . Heart disease Neg Hx   . Diabetes Maternal Grandmother     brohter  . Hypertension Neg Hx     No Known Allergies  Current Outpatient Prescriptions on File Prior to Visit  Medication Sig Dispense Refill  . albuterol (PROVENTIL HFA;VENTOLIN HFA) 108 (90 BASE) MCG/ACT inhaler Inhale 2 puffs into the lungs every 6 (six) hours as needed for wheezing or shortness of breath. 1 Inhaler 2  . albuterol (PROVENTIL) (2.5 MG/3ML) 0.083% nebulizer solution Take 3 mLs (2.5 mg total) by nebulization every 6 (six) hours as needed for wheezing or shortness of breath. 150 mL 1  . beclomethasone (QVAR) 40 MCG/ACT inhaler Inhale 2 puffs into the lungs 2 (two) times daily. 1 Inhaler 1  . progesterone (PROMETRIUM) 100 MG capsule Take 100 mg by mouth at bedtime.     No current facility-administered medications on file prior to visit.    BP 122/80 mmHg  Pulse 100  Temp(Src) 97.8 F (36.6 C) (Oral)  Ht 5' 8.75" (1.746 m)  Wt 288 lb (130.636 kg)  BMI  42.85 kg/m2  SpO2 98%  LMP 03/16/2015       Objective:   Physical Exam  General  Mental Status - Alert. General Appearance - Well groomed. Not in acute distress.  Skin Rashes- No Rashes.  HEENT Head- Normal. Ear Auditory Canal - Left- Normal. Right - Normal.Tympanic Membrane- Left-tm red. Right- Normal. Eye Sclera/Conjunctiva- Left- Normal. Right- Normal. Nose & Sinuses Nasal Mucosa- Left-  Boggy and Congested. Right-  Boggy and  Congested.Bilateral maxillary sinus pressure and frontal sinus pressure. Mouth & Throat Lips: Upper Lip- Normal: no dryness, cracking, pallor, cyanosis, or vesicular eruption. Lower Lip-Normal: no dryness, cracking, pallor, cyanosis or vesicular eruption. Buccal Mucosa- Bilateral- No Aphthous ulcers. Oropharynx- No Discharge or Erythema. Tonsils: Characteristics- Bilateral- No Erythema or  Congestion. Size/Enlargement- Bilateral- No enlargement. Discharge- bilateral-None.  Neck Neck- Supple. No Masses.   Chest and Lung Exam Auscultation: Breath Sounds:-Clear even and unlabored.  Cardiovascular Auscultation:Rythm- Regular, rate and rhythm. Murmurs & Other Heart Sounds:Ausculatation of the heart reveal- No Murmurs.  Lymphatic Head & Neck General Head & Neck Lymphatics: Bilateral: Description- No Localized lymphadenopathy.       Assessment & Plan:  Your appear to have a sinus infection. I am prescribing augmentin antibiotic for the infection. To help with the nasal congestion I prescribed flonase nasal steroid. For your associated cough, I prescribed cough medicine benzonatate  If your wheezing worsen and having to use albuterol frequently then restart qvar.  Then antibiotic for your sinus infection should treat lt OM as well.  Rest, hydrate, tylenol for fever.  Follow up in 7 days or as needed.

## 2015-04-09 NOTE — Progress Notes (Signed)
Pre visit review using our clinic review tool, if applicable. No additional management support is needed unless otherwise documented below in the visit note. 

## 2015-07-02 MED FILL — PHENTERMINE 37.5 MG TABLET: 37.5 | 30 days supply | Qty: 30 | Fill #1

## 2015-07-02 MED FILL — PROGESTERONE 100 MG CAPSULE: 100 | 10 days supply | Qty: 10 | Fill #2

## 2015-07-02 MED FILL — metFORMIN HCL 500 MG TABS: 500 | 30 days supply | Qty: 60 | Fill #1

## 2015-07-02 MED FILL — VENTOLIN HFA 90 MCG INHALER: 108 (90 BAS | 30 days supply | Qty: 18 | Fill #1

## 2015-07-29 MED FILL — metFORMIN HCL 500 MG TABS: 500 | 30 days supply | Qty: 60 | Fill #2

## 2015-07-29 MED FILL — PHENTERMINE 37.5 MG TABLET: 37.5 | 30 days supply | Qty: 30 | Fill #2

## 2015-08-21 ENCOUNTER — Encounter: Payer: Self-pay | Admitting: Family Medicine

## 2015-08-21 ENCOUNTER — Ambulatory Visit (INDEPENDENT_AMBULATORY_CARE_PROVIDER_SITE_OTHER): Payer: 59 | Admitting: Family Medicine

## 2015-08-21 VITALS — BP 126/100 | HR 98 | Temp 98.4°F | Ht 68.75 in | Wt 279.0 lb

## 2015-08-21 DIAGNOSIS — J019 Acute sinusitis, unspecified: Secondary | ICD-10-CM | POA: Diagnosis not present

## 2015-08-21 MED ORDER — AZITHROMYCIN 250 MG PO TABS: ORAL_TABLET | ORAL | Status: DC

## 2015-08-21 MED ORDER — HYDROCODONE-HOMATROPINE 5-1.5 MG/5ML PO SYRP: 5.0000 mL | ORAL_SOLUTION | ORAL | Status: DC | PRN

## 2015-08-21 NOTE — Progress Notes (Signed)
Pre visit review using our clinic review tool, if applicable. No additional management support is needed unless otherwise documented below in the visit note. 

## 2015-08-24 ENCOUNTER — Encounter: Payer: Self-pay | Admitting: Family Medicine

## 2015-08-24 NOTE — Progress Notes (Signed)
   Subjective:    Patient ID: Kelly Ellison, female    DOB: December 20, 1990, 25 y.o.   MRN: [id number]  HPI Here for 3 weeks of sinus pressure, PND, ST, and a dry cough.    Review of Systems  Constitutional: Negative.   HENT: Positive for congestion, postnasal drip, sinus pressure and sore throat. Negative for ear pain.   Eyes: Negative.   Respiratory: Positive for cough.        Objective:   Physical Exam  Constitutional: She appears well-developed and well-nourished.  HENT:  Right Ear: External ear normal.  Left Ear: External ear normal.  Nose: Nose normal.  Mouth/Throat: Oropharynx is clear and moist.  Eyes: Conjunctivae are normal.  Neck: No thyromegaly present.  Cardiovascular: Normal rate, regular rhythm, normal heart sounds and intact distal pulses.   Pulmonary/Chest: Effort normal and breath sounds normal.  Lymphadenopathy:    She has no cervical adenopathy.          Assessment & Plan:  Sinusitis, treat with a Zpack and Mucinex

## 2015-08-31 ENCOUNTER — Telehealth: Payer: Self-pay | Admitting: Medical

## 2015-08-31 NOTE — Telephone Encounter (Signed)
LM for pt to call and schedule flu shot or update records & cpe due in April

## 2015-09-01 MED FILL — metFORMIN HCL 500 MG TABS: 500 | 30 days supply | Qty: 60 | Fill #3

## 2015-09-01 MED FILL — PHENTERMINE 37.5 MG TABLET: 37.5 | 30 days supply | Qty: 30 | Fill #3

## 2015-09-04 MED FILL — VENTOLIN HFA 90 MCG INHALER: 108 (90 BAS | 30 days supply | Qty: 18 | Fill #2

## 2015-09-04 MED FILL — QVAR 40 MCG ORAL INHALER: 40 | 30 days supply | Qty: 9 | Fill #1

## 2015-09-24 MED FILL — metFORMIN HCL 500 MG TABS: 500 | 30 days supply | Qty: 60 | Fill #4 | Status: TO

## 2017-06-07 LAB — OB RESULTS CONSOLE RPR: RPR: NONREACTIVE

## 2017-06-07 LAB — OB RESULTS CONSOLE RUBELLA ANTIBODY, IGM: Rubella: IMMUNE

## 2017-06-07 LAB — OB RESULTS CONSOLE ABO/RH: RH Type: POSITIVE

## 2017-06-07 LAB — OB RESULTS CONSOLE ANTIBODY SCREEN: ANTIBODY SCREEN: NEGATIVE

## 2017-06-07 LAB — OB RESULTS CONSOLE HIV ANTIBODY (ROUTINE TESTING): HIV: NONREACTIVE

## 2017-06-07 LAB — OB RESULTS CONSOLE GC/CHLAMYDIA
CHLAMYDIA, DNA PROBE: NEGATIVE
Gonorrhea: NEGATIVE

## 2017-06-07 LAB — OB RESULTS CONSOLE HEPATITIS B SURFACE ANTIGEN: HEP B S AG: NEGATIVE

## 2017-08-10 ENCOUNTER — Encounter (HOSPITAL_COMMUNITY): Payer: Self-pay

## 2017-08-10 ENCOUNTER — Encounter (HOSPITAL_COMMUNITY): Payer: Self-pay | Admitting: *Deleted

## 2017-08-10 ENCOUNTER — Other Ambulatory Visit (HOSPITAL_COMMUNITY): Payer: Self-pay | Admitting: Obstetrics and Gynecology

## 2017-08-10 ENCOUNTER — Ambulatory Visit (HOSPITAL_COMMUNITY)
Admission: RE | Admit: 2017-08-10 | Discharge: 2017-08-10 | Disposition: A | Discharge status: Home / Self Care | Payer: 59 | Source: Ambulatory Visit | Attending: Obstetrics and Gynecology | Admitting: Obstetrics and Gynecology

## 2017-08-10 ENCOUNTER — Other Ambulatory Visit: Payer: Self-pay

## 2017-08-10 DIAGNOSIS — Q6689 Other  specified congenital deformities of feet: Secondary | ICD-10-CM

## 2017-08-10 DIAGNOSIS — Z3689 Encounter for other specified antenatal screening: Secondary | ICD-10-CM

## 2017-08-10 DIAGNOSIS — E282 Polycystic ovarian syndrome: Secondary | ICD-10-CM

## 2017-08-10 DIAGNOSIS — O99212 Obesity complicating pregnancy, second trimester: Secondary | ICD-10-CM | POA: Diagnosis not present

## 2017-08-10 DIAGNOSIS — O359XX0 Maternal care for (suspected) fetal abnormality and damage, unspecified, not applicable or unspecified: Secondary | ICD-10-CM

## 2017-08-10 DIAGNOSIS — Z8759 Personal history of other complications of pregnancy, childbirth and the puerperium: Secondary | ICD-10-CM | POA: Diagnosis not present

## 2017-08-10 DIAGNOSIS — J45909 Unspecified asthma, uncomplicated: Secondary | ICD-10-CM

## 2017-08-10 DIAGNOSIS — Z3A19 19 weeks gestation of pregnancy: Secondary | ICD-10-CM | POA: Diagnosis not present

## 2017-08-10 DIAGNOSIS — O99512 Diseases of the respiratory system complicating pregnancy, second trimester: Secondary | ICD-10-CM

## 2017-08-10 DIAGNOSIS — O3662X9 Maternal care for excessive fetal growth, second trimester, other fetus: Secondary | ICD-10-CM

## 2017-08-10 DIAGNOSIS — O09292 Supervision of pregnancy with other poor reproductive or obstetric history, second trimester: Secondary | ICD-10-CM | POA: Insufficient documentation

## 2017-08-10 DIAGNOSIS — Q6602 Congenital talipes equinovarus, left foot: Secondary | ICD-10-CM

## 2017-08-10 HISTORY — DX: Polycystic ovarian syndrome: E28.2

## 2017-11-28 ENCOUNTER — Other Ambulatory Visit: Payer: Self-pay

## 2017-11-28 ENCOUNTER — Inpatient Hospital Stay (HOSPITAL_COMMUNITY)
Admission: AD | Admit: 2017-11-28 | Discharge: 2017-11-28 | Disposition: A | Discharge status: Home / Self Care | Payer: 59 | Source: Ambulatory Visit | Attending: Obstetrics and Gynecology | Admitting: Obstetrics and Gynecology

## 2017-11-28 ENCOUNTER — Encounter (HOSPITAL_COMMUNITY): Payer: Self-pay

## 2017-11-28 DIAGNOSIS — Z7982 Long term (current) use of aspirin: Secondary | ICD-10-CM | POA: Diagnosis not present

## 2017-11-28 DIAGNOSIS — T1490XA Injury, unspecified, initial encounter: Secondary | ICD-10-CM

## 2017-11-28 DIAGNOSIS — O99283 Endocrine, nutritional and metabolic diseases complicating pregnancy, third trimester: Secondary | ICD-10-CM | POA: Diagnosis not present

## 2017-11-28 DIAGNOSIS — Z9889 Other specified postprocedural states: Secondary | ICD-10-CM | POA: Diagnosis not present

## 2017-11-28 DIAGNOSIS — Y92410 Unspecified street and highway as the place of occurrence of the external cause: Secondary | ICD-10-CM | POA: Diagnosis not present

## 2017-11-28 DIAGNOSIS — Z7984 Long term (current) use of oral hypoglycemic drugs: Secondary | ICD-10-CM | POA: Insufficient documentation

## 2017-11-28 DIAGNOSIS — O99513 Diseases of the respiratory system complicating pregnancy, third trimester: Secondary | ICD-10-CM | POA: Insufficient documentation

## 2017-11-28 DIAGNOSIS — J45909 Unspecified asthma, uncomplicated: Secondary | ICD-10-CM | POA: Diagnosis not present

## 2017-11-28 DIAGNOSIS — E282 Polycystic ovarian syndrome: Secondary | ICD-10-CM | POA: Diagnosis not present

## 2017-11-28 DIAGNOSIS — O9A213 Injury, poisoning and certain other consequences of external causes complicating pregnancy, third trimester: Secondary | ICD-10-CM

## 2017-11-28 DIAGNOSIS — Z3A35 35 weeks gestation of pregnancy: Secondary | ICD-10-CM | POA: Diagnosis not present

## 2017-11-28 DIAGNOSIS — Z8261 Family history of arthritis: Secondary | ICD-10-CM | POA: Diagnosis not present

## 2017-11-28 DIAGNOSIS — Z833 Family history of diabetes mellitus: Secondary | ICD-10-CM | POA: Insufficient documentation

## 2017-11-28 DIAGNOSIS — Z79899 Other long term (current) drug therapy: Secondary | ICD-10-CM | POA: Insufficient documentation
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
## 2017-11-28 NOTE — MAU Note (Signed)
Urine sent to lab 
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
## 2017-12-12 ENCOUNTER — Telehealth (HOSPITAL_COMMUNITY): Payer: Self-pay | Admitting: *Deleted

## 2017-12-12 ENCOUNTER — Encounter (HOSPITAL_COMMUNITY): Payer: Self-pay | Admitting: *Deleted

## 2017-12-12 NOTE — Telephone Encounter (Signed)
Preadmission screen  

## 2017-12-21 ENCOUNTER — Encounter (HOSPITAL_COMMUNITY): Payer: Self-pay | Admitting: *Deleted

## 2017-12-21 ENCOUNTER — Inpatient Hospital Stay (HOSPITAL_COMMUNITY)
Admission: AD | Admit: 2017-12-21 | Discharge: 2017-12-23 | DRG: 807 | Disposition: A | Discharge status: Home / Self Care | Payer: 59 | Attending: Obstetrics and Gynecology | Admitting: Obstetrics and Gynecology

## 2017-12-21 ENCOUNTER — Other Ambulatory Visit: Payer: Self-pay

## 2017-12-21 DIAGNOSIS — E282 Polycystic ovarian syndrome: Secondary | ICD-10-CM | POA: Diagnosis present

## 2017-12-21 DIAGNOSIS — Z3483 Encounter for supervision of other normal pregnancy, third trimester: Secondary | ICD-10-CM | POA: Diagnosis present

## 2017-12-21 DIAGNOSIS — Z3A38 38 weeks gestation of pregnancy: Secondary | ICD-10-CM

## 2017-12-21 DIAGNOSIS — O99284 Endocrine, nutritional and metabolic diseases complicating childbirth: Secondary | ICD-10-CM | POA: Diagnosis present

## 2017-12-21 DIAGNOSIS — O358XX0 Maternal care for other (suspected) fetal abnormality and damage, not applicable or unspecified: Secondary | ICD-10-CM | POA: Diagnosis present

## 2017-12-21 LAB — TYPE AND SCREEN
ABO/RH(D): A POS
ANTIBODY SCREEN: NEGATIVE

## 2017-12-21 LAB — CBC
HCT: 30.8 % — ABNORMAL LOW (ref 36.0–46.0)
Hemoglobin: 9.6 g/dL — ABNORMAL LOW (ref 12.0–15.0)
MCH: 24.2 pg — AB (ref 26.0–34.0)
MCHC: 31.2 g/dL (ref 30.0–36.0)
MCV: 77.6 fL — ABNORMAL LOW (ref 78.0–100.0)
PLATELETS: 252 10*3/uL (ref 150–400)
RBC: 3.97 MIL/uL (ref 3.87–5.11)
RDW: 16.2 % — ABNORMAL HIGH (ref 11.5–15.5)
WBC: 13.5 10*3/uL — ABNORMAL HIGH (ref 4.0–10.5)

## 2017-12-21 MED ORDER — LACTATED RINGERS IV SOLN
INTRAVENOUS | Status: DC
  Administered 2017-12-21 – 2017-12-22 (×2): via INTRAVENOUS

## 2017-12-21 NOTE — MAU Note (Signed)
Pt reports contractions every 6-7 mins. States she was 4cm this morning. Pt denies LOF. Reports bloody mucous. Reports good fetal movement.

## 2017-12-22 ENCOUNTER — Inpatient Hospital Stay (HOSPITAL_COMMUNITY): Payer: 59 | Admitting: Anesthesiology

## 2017-12-22 ENCOUNTER — Encounter (HOSPITAL_COMMUNITY): Payer: Self-pay | Admitting: *Deleted

## 2017-12-22 LAB — RPR: RPR: NONREACTIVE

## 2017-12-22 LAB — ABO/RH: ABO/RH(D): A POS

## 2017-12-22 MED ORDER — ALBUTEROL SULFATE (2.5 MG/3ML) 0.083% IN NEBU: 3.0000 mL | INHALATION_SOLUTION | Freq: Four times a day (QID) | RESPIRATORY_TRACT | Status: DC | PRN

## 2017-12-22 MED ORDER — OXYCODONE-ACETAMINOPHEN 5-325 MG PO TABS: 2.0000 | ORAL_TABLET | ORAL | Status: DC | PRN

## 2017-12-22 MED ORDER — PHENYLEPHRINE 40 MCG/ML (10ML) SYRINGE FOR IV PUSH (FOR BLOOD PRESSURE SUPPORT)
80.0000 ug | PREFILLED_SYRINGE | INTRAVENOUS | Status: DC | PRN
  Filled 2017-12-22: qty 5

## 2017-12-22 MED ORDER — EPHEDRINE 5 MG/ML INJ
10.0000 mg | INTRAVENOUS | Status: DC | PRN
  Filled 2017-12-22: qty 2

## 2017-12-22 MED ORDER — FLEET ENEMA 7-19 GM/118ML RE ENEM: 1.0000 | ENEMA | RECTAL | Status: DC | PRN

## 2017-12-22 MED ORDER — OXYTOCIN 40 UNITS IN LACTATED RINGERS INFUSION - SIMPLE MED
1.0000 m[IU]/min | INTRAVENOUS | Status: DC
  Administered 2017-12-22: 2 m[IU]/min via INTRAVENOUS

## 2017-12-22 MED ORDER — OXYCODONE HCL 5 MG PO TABS: 5.0000 mg | ORAL_TABLET | ORAL | Status: DC | PRN

## 2017-12-22 MED ORDER — FENTANYL 2.5 MCG/ML BUPIVACAINE 1/10 % EPIDURAL INFUSION (WH - ANES): 14.0000 mL/h | INTRAMUSCULAR | Status: DC | PRN

## 2017-12-22 MED ORDER — SENNOSIDES-DOCUSATE SODIUM 8.6-50 MG PO TABS
2.0000 | ORAL_TABLET | ORAL | Status: DC
  Administered 2017-12-22: 2 via ORAL
  Filled 2017-12-22: qty 2

## 2017-12-22 MED ORDER — ACETAMINOPHEN 325 MG PO TABS: 650.0000 mg | ORAL_TABLET | ORAL | Status: DC | PRN

## 2017-12-22 MED ORDER — LIDOCAINE HCL (PF) 1 % IJ SOLN
30.0000 mL | INTRAMUSCULAR | Status: DC | PRN
  Filled 2017-12-22: qty 30

## 2017-12-22 MED ORDER — LIDOCAINE HCL (PF) 1 % IJ SOLN
INTRAMUSCULAR | Status: DC | PRN
  Administered 2017-12-22 (×2): 5 mL via EPIDURAL

## 2017-12-22 MED ORDER — OXYCODONE-ACETAMINOPHEN 5-325 MG PO TABS: 1.0000 | ORAL_TABLET | ORAL | Status: DC | PRN

## 2017-12-22 MED ORDER — TERBUTALINE SULFATE 1 MG/ML IJ SOLN
0.2500 mg | Freq: Once | INTRAMUSCULAR | Status: DC | PRN
  Filled 2017-12-22: qty 1

## 2017-12-22 MED ORDER — DIPHENHYDRAMINE HCL 50 MG/ML IJ SOLN: 12.5000 mg | INTRAMUSCULAR | Status: DC | PRN

## 2017-12-22 MED ORDER — ONDANSETRON HCL 4 MG/2ML IJ SOLN
4.0000 mg | Freq: Four times a day (QID) | INTRAMUSCULAR | Status: DC | PRN
  Administered 2017-12-22: 4 mg via INTRAVENOUS
  Filled 2017-12-22: qty 2

## 2017-12-22 MED ORDER — LACTATED RINGERS IV SOLN: 500.0000 mL | Freq: Once | INTRAVENOUS | Status: DC

## 2017-12-22 MED ORDER — ONDANSETRON HCL 4 MG PO TABS: 4.0000 mg | ORAL_TABLET | ORAL | Status: DC | PRN

## 2017-12-22 MED ORDER — FENTANYL 2.5 MCG/ML BUPIVACAINE 1/10 % EPIDURAL INFUSION (WH - ANES)
14.0000 mL/h | INTRAMUSCULAR | Status: DC | PRN
  Administered 2017-12-22: 14 mL/h via EPIDURAL
  Filled 2017-12-22: qty 100

## 2017-12-22 MED ORDER — SOD CITRATE-CITRIC ACID 500-334 MG/5ML PO SOLN: 30.0000 mL | ORAL | Status: DC | PRN

## 2017-12-22 MED ORDER — DIBUCAINE 1 % RE OINT: 1.0000 "application " | TOPICAL_OINTMENT | RECTAL | Status: DC | PRN

## 2017-12-22 MED ORDER — OXYTOCIN 40 UNITS IN LACTATED RINGERS INFUSION - SIMPLE MED
2.5000 [IU]/h | INTRAVENOUS | Status: DC
  Filled 2017-12-22: qty 1000

## 2017-12-22 MED ORDER — PRENATAL MULTIVITAMIN CH
1.0000 | ORAL_TABLET | Freq: Every day | ORAL | Status: DC
  Administered 2017-12-23: 1 via ORAL
  Filled 2017-12-22: qty 1

## 2017-12-22 MED ORDER — ONDANSETRON HCL 4 MG/2ML IJ SOLN: 4.0000 mg | INTRAMUSCULAR | Status: DC | PRN

## 2017-12-22 MED ORDER — BENZOCAINE-MENTHOL 20-0.5 % EX AERO
1.0000 "application " | INHALATION_SPRAY | CUTANEOUS | Status: DC | PRN
  Administered 2017-12-22: 1 via TOPICAL
  Filled 2017-12-22: qty 56

## 2017-12-22 MED ORDER — LACTATED RINGERS IV SOLN: 500.0000 mL | INTRAVENOUS | Status: DC | PRN

## 2017-12-22 MED ORDER — METFORMIN HCL 500 MG PO TABS
500.0000 mg | ORAL_TABLET | Freq: Two times a day (BID) | ORAL | Status: DC
  Administered 2017-12-22 – 2017-12-23 (×2): 500 mg via ORAL
  Filled 2017-12-22 (×4): qty 1

## 2017-12-22 MED ORDER — OXYTOCIN BOLUS FROM INFUSION
500.0000 mL | Freq: Once | INTRAVENOUS | Status: AC
  Administered 2017-12-22: 500 mL via INTRAVENOUS

## 2017-12-22 MED ORDER — PHENYLEPHRINE 40 MCG/ML (10ML) SYRINGE FOR IV PUSH (FOR BLOOD PRESSURE SUPPORT)
80.0000 ug | PREFILLED_SYRINGE | INTRAVENOUS | Status: DC | PRN
  Filled 2017-12-22: qty 10
  Filled 2017-12-22: qty 5

## 2017-12-22 MED ORDER — TETANUS-DIPHTH-ACELL PERTUSSIS 5-2.5-18.5 LF-MCG/0.5 IM SUSP: 0.5000 mL | Freq: Once | INTRAMUSCULAR | Status: DC

## 2017-12-22 MED ORDER — COCONUT OIL OIL: 1.0000 "application " | TOPICAL_OIL | Status: DC | PRN

## 2017-12-22 MED ORDER — WITCH HAZEL-GLYCERIN EX PADS: 1.0000 "application " | MEDICATED_PAD | CUTANEOUS | Status: DC | PRN

## 2017-12-22 MED ORDER — IBUPROFEN 600 MG PO TABS
600.0000 mg | ORAL_TABLET | Freq: Four times a day (QID) | ORAL | Status: DC
  Administered 2017-12-22 – 2017-12-23 (×4): 600 mg via ORAL
  Filled 2017-12-22 (×4): qty 1

## 2017-12-22 MED ORDER — ZOLPIDEM TARTRATE 5 MG PO TABS: 5.0000 mg | ORAL_TABLET | Freq: Every evening | ORAL | Status: DC | PRN

## 2017-12-22 MED ORDER — DIPHENHYDRAMINE HCL 25 MG PO CAPS: 25.0000 mg | ORAL_CAPSULE | Freq: Four times a day (QID) | ORAL | Status: DC | PRN

## 2017-12-22 MED ORDER — OXYCODONE HCL 5 MG PO TABS: 10.0000 mg | ORAL_TABLET | ORAL | Status: DC | PRN

## 2017-12-22 MED ORDER — SIMETHICONE 80 MG PO CHEW: 80.0000 mg | CHEWABLE_TABLET | ORAL | Status: DC | PRN

## 2017-12-22 NOTE — Progress Notes (Signed)
This note also relates to the following rows which could not be included:  SpO2 - Cannot attach notes to unvalidated device data

## 2017-12-22 NOTE — Anesthesia Preprocedure Evaluation (Signed)
Anesthesia Evaluation  Patient identified by MRN, date of birth, ID band Patient awake    Reviewed: Allergy & Precautions, H&P , NPO status , Patient's Chart, lab work & pertinent test results  History of Anesthesia Complications Negative for: history of anesthetic complications  Airway Mallampati: II  TM Distance: >3 FB Neck ROM: full    Dental no notable dental hx. (+) Teeth Intact   Pulmonary asthma ,    Pulmonary exam normal breath sounds clear to auscultation       Cardiovascular negative cardio ROS Normal cardiovascular exam Rhythm:regular Rate:Normal     Neuro/Psych negative neurological ROS  negative psych ROS   GI/Hepatic negative GI ROS, Neg liver ROS,   Endo/Other  Morbid obesityPCOS (polycystic ovarian syndrome)  Renal/GU negative Renal ROS  negative genitourinary   Musculoskeletal   Abdominal (+) + obese,   Peds  Hematology  (+) anemia ,   Anesthesia Other Findings   Reproductive/Obstetrics (+) Pregnancy                             Anesthesia Physical Anesthesia Plan  ASA: III  Anesthesia Plan: Epidural   Post-op Pain Management:    Induction:   PONV Risk Score and Plan:   Airway Management Planned:   Additional Equipment:   Intra-op Plan:   Post-operative Plan:   Informed Consent: I have reviewed the patients History and Physical, chart, labs and discussed the procedure including the risks, benefits and alternatives for the proposed anesthesia with the patient or authorized representative who has indicated his/her understanding and acceptance.     Plan Discussed with:   Anesthesia Plan Comments:         Anesthesia Quick Evaluation

## 2017-12-22 NOTE — Progress Notes (Signed)
FHT cat one, fetal arrhythmia not noted while I was in room UCs q3-5 min Cx 4/90/-2/vtx IUPC placed D/W patient pitocin augmentation

## 2017-12-22 NOTE — Anesthesia Postprocedure Evaluation (Signed)
Anesthesia Post Note  Patient: Kelly KernSarah A Devine  Procedure(s) Performed: AN AD HOC LABOR EPIDURAL     Patient location during evaluation: Mother Baby Anesthesia Type: Epidural Level of consciousness: awake and alert Pain management: pain level controlled Vital Signs Assessment: post-procedure vital signs reviewed and stable Respiratory status: spontaneous breathing, nonlabored ventilation and respiratory function stable Cardiovascular status: stable Postop Assessment: no headache, no backache and epidural receding Anesthetic complications: no    Last Vitals:  Vitals:   12/22/17 1538 12/22/17 1644  BP: 130/76 (!) 143/81  Pulse: 94 (!) 105  Resp: 18 18  Temp: 37.4 C 37.1 C  SpO2:  99%    Last Pain:  Vitals:   12/22/17 1811  TempSrc:   PainSc: 0-No pain   Pain Goal:                 Junious SilkGILBERT,Reyah Streeter

## 2017-12-22 NOTE — Progress Notes (Addendum)
This note also relates to the following rows which could not be included: SpO2 - Cannot attach notes to unvalidated device data   0455 Called Dr. Renaldo FiddlerAdkins- reported audible heart rate change- with extra beat in 40-60's. Not a decel. RN assessing- changed  Position. No new orders received- will continue to monitor.

## 2017-12-22 NOTE — Progress Notes (Signed)
Delivery Note At 1:50 PM a viable female was delivered via Vaginal, Spontaneous (Presentation:LOA ;  ).  APGAR: 9, 10; weight  .   Placenta status:intack , .  Cord:  3 vessels with the following complications: .  Cord pH: not done  Anesthesia:  epidural Episiotomy: None Lacerations: 2nd degree ML repaired Suture Repair: 2.0 vicryl rapide Est. Blood Loss (mL): 200  Mom to postpartum.  Baby to Couplet care / Skin to Skin.  Roselle LocusJames E Korri Ask II 12/22/2017, 2:03 PM

## 2017-12-22 NOTE — Anesthesia Pain Management Evaluation Note (Signed)
  CRNA Pain Management Visit Note  Patient: Kelly KernSarah A Dwan, 27 y.o., female  "Hello I am a member of the anesthesia team at [facility]. We have an anesthesia team available at all times to provide care throughout the hospital, including epidural management and anesthesia for C-section. I don't know your plan for the delivery whether it a natural birth, water birth, IV sedation, nitrous supplementation, doula or epidural, but we want to meet your pain goals."   1.Was your pain managed to your expectations on prior hospitalizations?   Yes   2.What is your expectation for pain management during this hospitalization?     Epidural  3.How can we help you reach that goal? Ready for epidural nos. MDA contacted.  Record the patient's initial score and the patient's pain goal.   Pain: 7  Pain Goal: 1 [facility] wants you to be able to say your pain was always managed very well.  [name] 12/22/2017

## 2017-12-22 NOTE — H&P (Signed)
Kelly Ellison is a 27 y.o. female presenting for SOL.  Pt reports increase in ctx frequency and intensity.  No vb or lof.  Pt with h/o PIH - BP normal ok in this pregnancy, taking ASA qd.  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  H/o PCOS on metformin.  Occasional episodes of fetal arrythmia noted - most likely c/w PAC.  No tachycardia or bradycardia.  No decelerations.  Good FM  OB History    Gravida  2   Para  1   Term  1   Preterm      AB      Living  1     SAB      TAB      Ectopic      Multiple      Live Births             Past Medical History:  Diagnosis Date  . Allergy   . Anemia   . Asthma    inhaler prn  . History of chicken pox    age 89  . History of gestational hypertension   . PCOS (polycystic ovarian syndrome)   . Vaginal Pap smear, abnormal    Past Surgical History:  Procedure Laterality Date  . CYSTECTOMY     left elbow  . EYE SURGERY     young age  . HYMENECTOMY    . SKIN TAG REMOVAL     vaginal   Family History: family history includes Arthritis in her mother; Diabetes in her maternal grandmother; Hypertension in her maternal grandmother; Migraines in her maternal grandmother, maternal uncle, and mother; Muscular dystrophy in her paternal aunt; Rheum arthritis in her maternal grandmother; Thyroid disease in her maternal grandmother and mother. Social History:  reports that she has never smoked. She has never used smokeless tobacco. She reports that she does not drink alcohol or use drugs.     Maternal Diabetes: No, PCOS on metformin Genetic Screening: Normal Maternal Ultrasounds/Referrals: Normal Fetal Ultrasounds or other Referrals:  Other: club foot Maternal Substance Abuse:  No Significant Maternal Medications:  Meds include: Other: metformin, ASA Significant Maternal Lab Results:  None Other Comments:  None  ROS History Dilation: 6 Effacement (%): 70 Station: -1 Exam by::  Kelly Ellison Blood pressure 134/82, pulse 97, temperature 98.5 F (36.9 C), temperature source Oral, resp. rate 16, height 5\' 8"  (1.727 m), weight 280 lb (127 kg), SpO2 96 %. Exam Physical Exam  Gen - mildly uncomfortable w/ epidural Abd - gravid, NT  EFW 7.5# Ext No edema Cvx 6cm, AROM - clear Prenatal labs: ABO, Rh: --/--/A POS, A POS Performed at [facility], [address] [place], [place] [address]  [phone](06/27 2212) Antibody: NEG (06/27 2212) Rubella: Immune (12/12 0000) RPR: Nonreactive (12/12 0000)  HBsAg: Negative (12/12 0000)  HIV: Non-reactive (12/12 0000)  GBS:   negative  Assessment/Plan: Admit Epidural prn Club foot Fetal arrythmia     Kelly Ellison 12/22/2017, 6:42 AM

## 2017-12-22 NOTE — Anesthesia Procedure Notes (Signed)
Epidural Patient location during procedure: OB Start time: 12/22/2017 8:40 AM End time: 12/22/2017 8:56 AM  Staffing Anesthesiologist: Leonides GrillsEllender, Devonne Lalani P, MD Performed: anesthesiologist   Preanesthetic Checklist Completed: patient identified, site marked, pre-op evaluation, timeout performed, IV checked, risks and benefits discussed and monitors and equipment checked  Epidural Patient position: sitting Prep: DuraPrep Patient monitoring: heart rate, cardiac monitor, continuous pulse ox and blood pressure Approach: midline Location: L3-L4 Injection technique: LOR air  Needle:  Needle type: Tuohy  Needle gauge: 17 G Needle length: 9 cm Needle insertion depth: 8 cm Catheter type: closed end flexible Catheter size: 19 Gauge Catheter at skin depth: 13 cm Test dose: negative and Other  Assessment Events: blood not aspirated, injection not painful, no injection resistance and negative IV test  Additional Notes Informed consent obtained prior to proceeding including risk of failure, 1% risk of PDPH, risk of minor discomfort and bruising. Discussed alternatives to epidural analgesia and patient desires to proceed.  Timeout performed pre-procedure verifying patient name, procedure, and platelet count.  Patient tolerated procedure well. Reason for block:procedure for pain

## 2017-12-23 LAB — CBC
HEMATOCRIT: 27 % — AB (ref 36.0–46.0)
HEMOGLOBIN: 8.5 g/dL — AB (ref 12.0–15.0)
MCH: 24.4 pg — AB (ref 26.0–34.0)
MCHC: 31.5 g/dL (ref 30.0–36.0)
MCV: 77.4 fL — ABNORMAL LOW (ref 78.0–100.0)
Platelets: 222 10*3/uL (ref 150–400)
RBC: 3.49 MIL/uL — ABNORMAL LOW (ref 3.87–5.11)
RDW: 16.7 % — ABNORMAL HIGH (ref 11.5–15.5)
WBC: 12.9 10*3/uL — ABNORMAL HIGH (ref 4.0–10.5)

## 2017-12-23 MED ORDER — ACETAMINOPHEN 325 MG PO TABS: 650.0000 mg | ORAL_TABLET | Freq: Four times a day (QID) | ORAL | 0 refills | Status: DC | PRN

## 2017-12-23 MED ORDER — IBUPROFEN 600 MG PO TABS: 600.0000 mg | ORAL_TABLET | Freq: Four times a day (QID) | ORAL | 0 refills | Status: DC | PRN

## 2017-12-23 MED ORDER — FERROUS SULFATE 325 (65 FE) MG PO TABS: 325.0000 mg | ORAL_TABLET | Freq: Every day | ORAL | 3 refills | Status: DC

## 2017-12-23 NOTE — Discharge Summary (Signed)
Obstetric Discharge Summary Reason for Admission: onset of labor Prenatal Procedures: none Intrapartum Procedures: spontaneous vaginal delivery Postpartum Procedures: none Complications-Operative and Postpartum: none Hemoglobin  Date Value Ref Range Status  12/23/2017 8.5 (L) 12.0 - 15.0 g/dL Final   HCT  Date Value Ref Range Status  12/23/2017 27.0 (L) 36.0 - 46.0 % Final    Physical Exam:  General: alert, cooperative and no distress Lochia: appropriate Uterine Fundus: firm Incision: healing well DVT Evaluation: No evidence of DVT seen on physical exam.  Discharge Diagnoses: Term Pregnancy-delivered  Discharge Information: Date: 12/23/2017 Activity: pelvic rest Diet: routine Medications: PNV and Ibuprofen Condition: stable Instructions: refer to practice specific booklet Discharge to: home   Newborn Data: Live born female  Birth Weight: 8 lb 3.6 oz (3731 g) APGAR: 9, 10  Newborn Delivery   Birth date/time:  12/22/2017 13:50:00 Delivery type:  Vaginal, Spontaneous     Home with mother.  Roselle LocusJames E Tonji Elliff II 12/23/2017, 9:04 AM

## 2017-12-26 ENCOUNTER — Inpatient Hospital Stay (HOSPITAL_COMMUNITY): Admission: RE | Admit: 2017-12-26 | Payer: 59 | Source: Ambulatory Visit

## 2018-04-12 IMAGING — US US MFM OB DETAIL+14 WK
1 series · 14 of 28 positions shown · non-contrast
Comparison: none

[Series 1: us mfm ob detail+14 wk · 124 acquisitions, 14 frames shown]
[im 5/124]
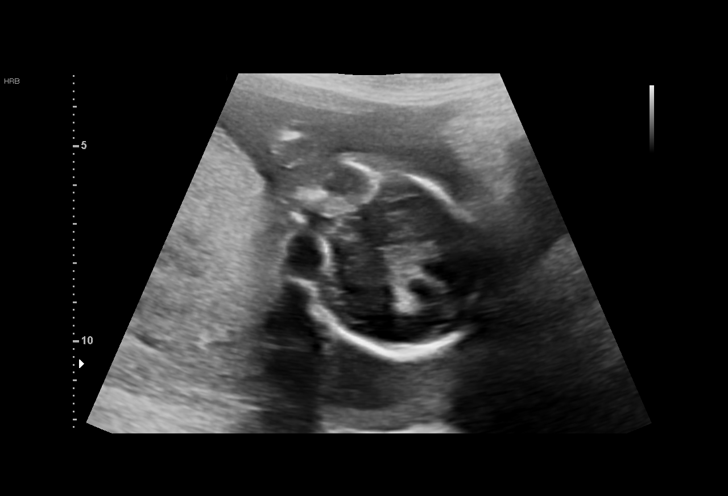
[im 14/124]
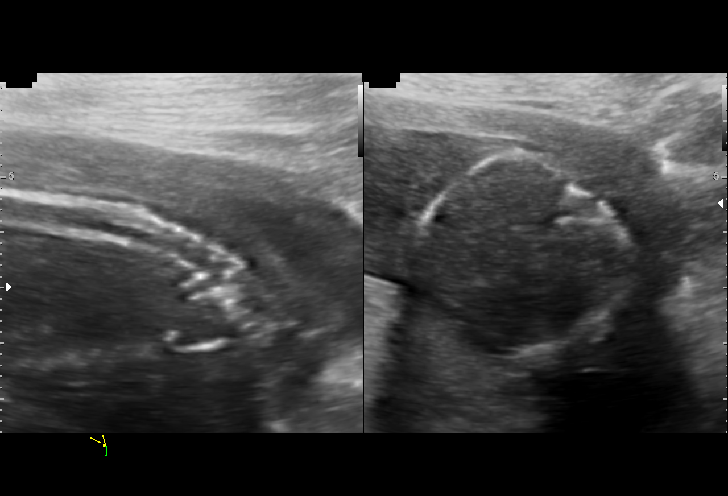
[im 23/124]
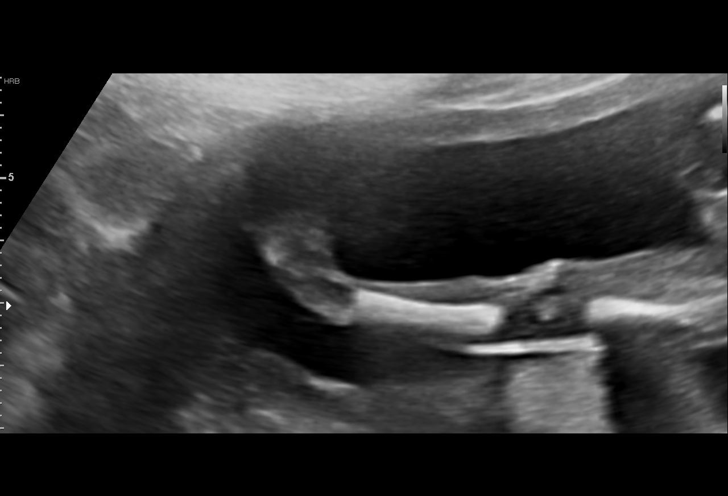
[im 32/124]
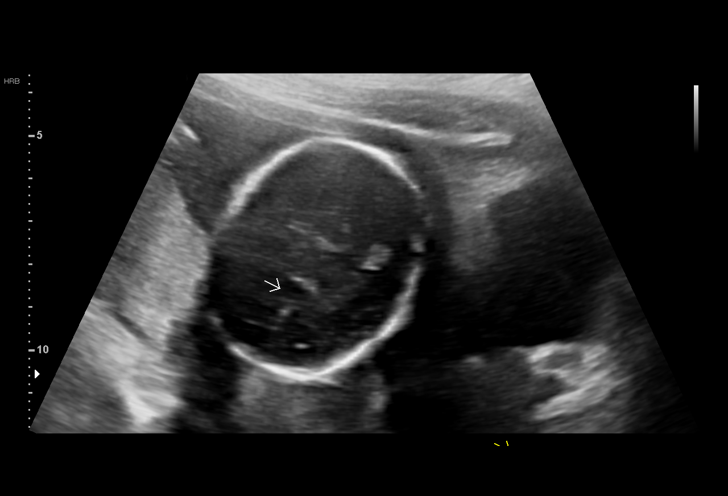
[im 42/124]
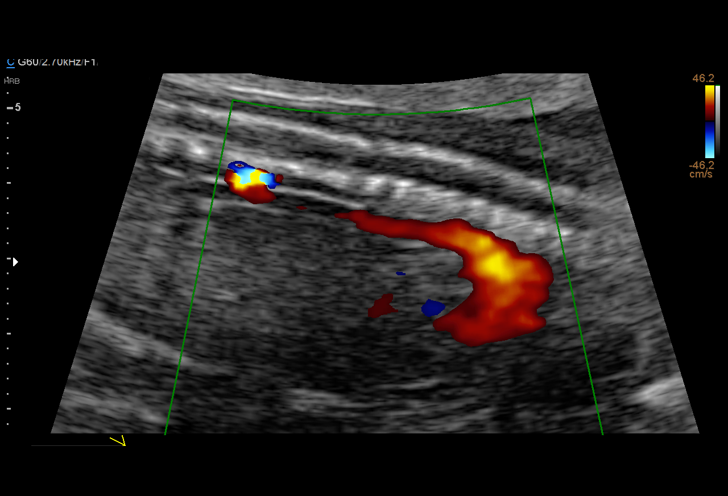
[im 51/124]
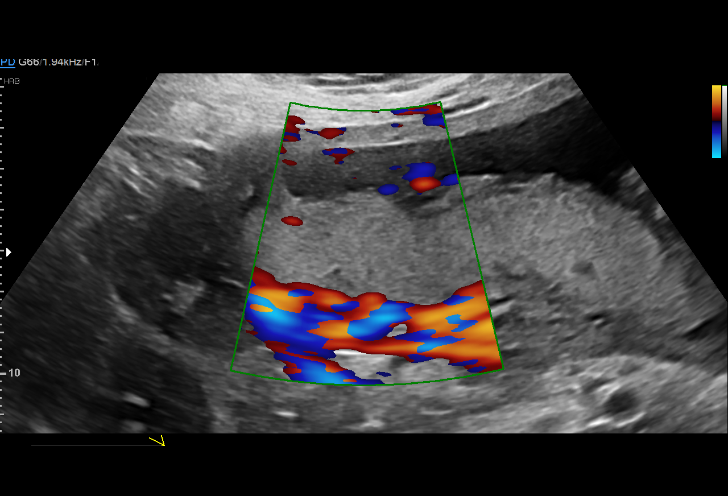
[im 60/124]
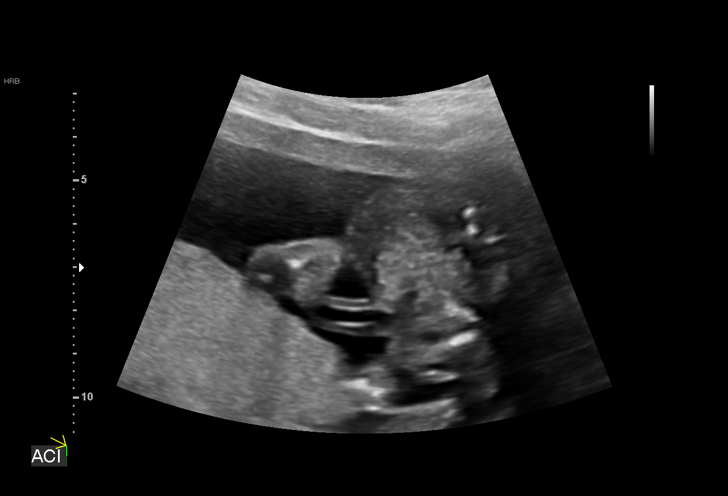
[im 69/124]
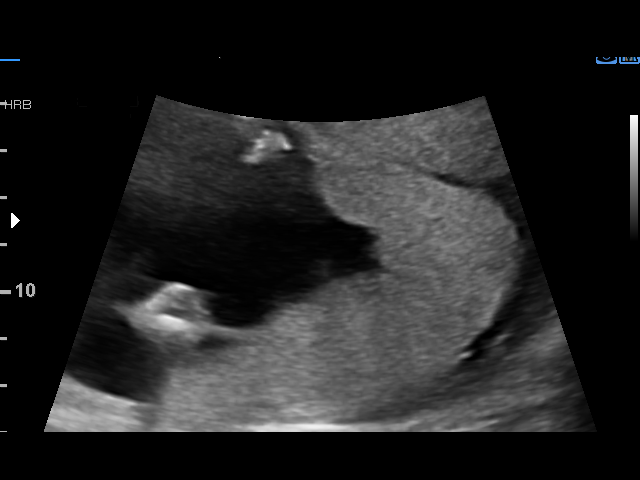
[im 78/124]
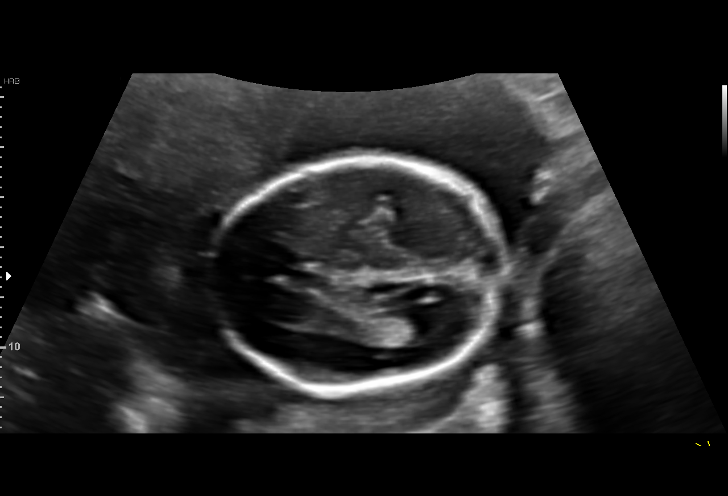
[im 87/124]
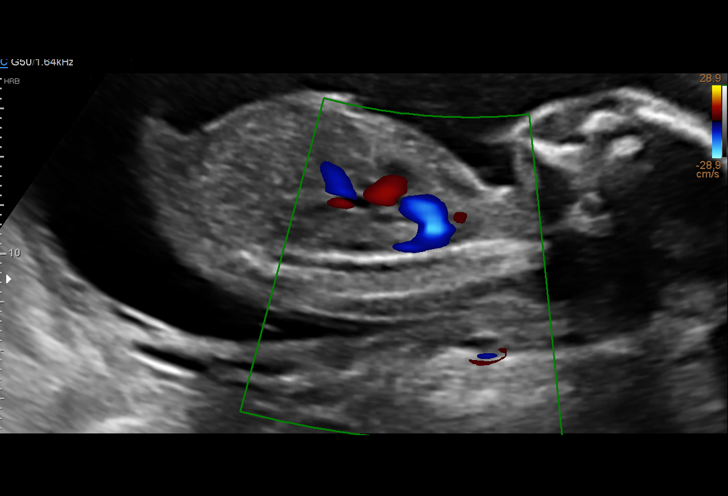
[im 96/124]
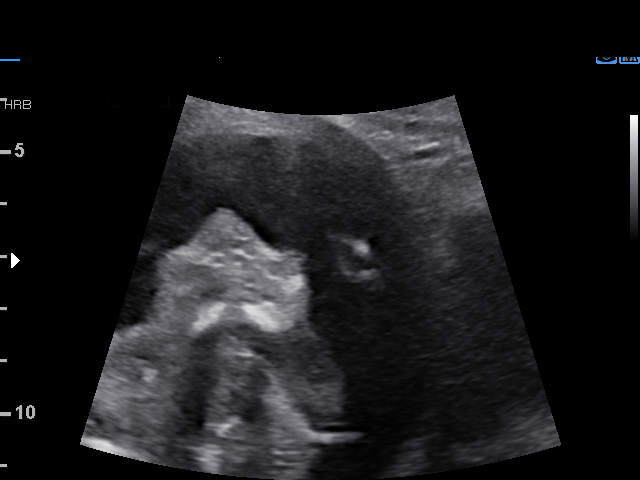
[im 105/124]
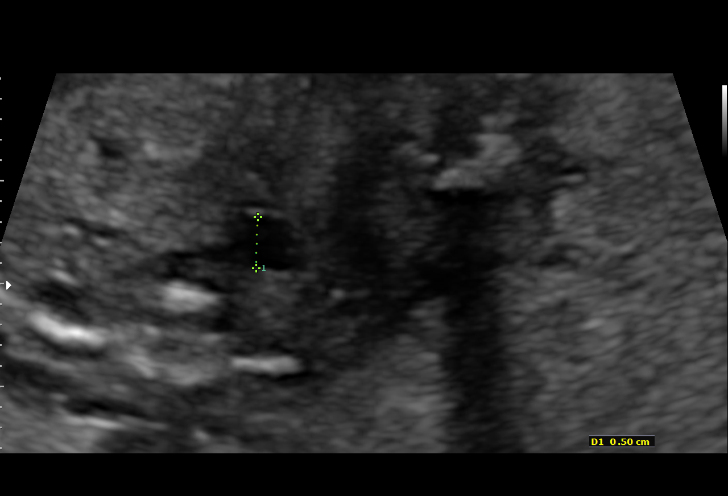
[im 114/124]
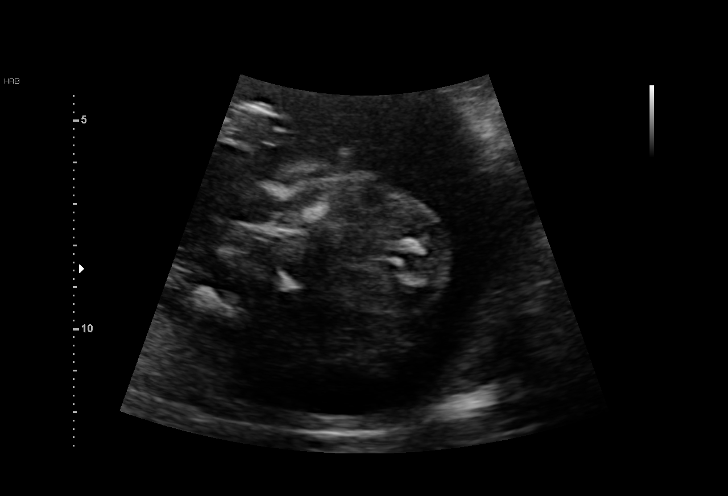
[im 124/124]
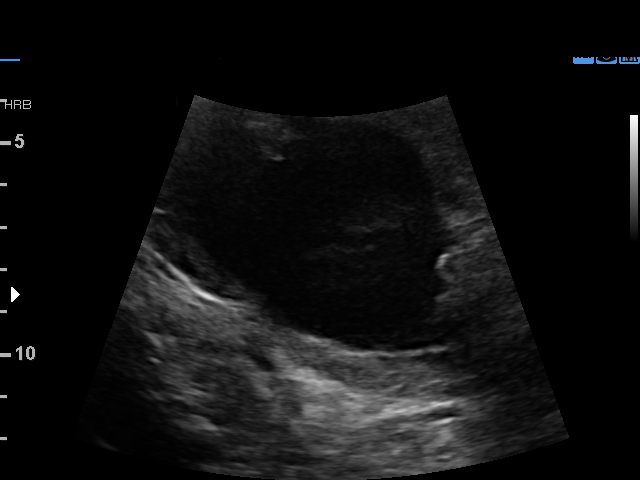

[14 of 28 positions shown; findings below may reference images not displayed]

#300

1  IVANA DAO          [id number]       [id number]     [id number]
Indications

19 weeks gestation of pregnancy
Club foot (Left)
Encounter for fetal anatomic survey
Obesity complicating pregnancy, second
trimester
Poor obstetric history: Previous gestational
HTN; Tiger
Maynor Joel obstetrical history (PCOS) on metformin
Asthma                                         [id number]
Fetal abnormality - other known or
suspected (bilateral renal pyelectasis)
OB History

Blood Type:            Height:  5'8"   Weight (lb):  271       BMI:
Gravidity:    2         Term:   1
Living:       1
Fetal Evaluation

Num Of Fetuses:     1
Fetal Heart         137
Rate(bpm):
Cardiac Activity:   Observed
Presentation:       Cephalic
Placenta:           Posterior, above cervical os
P. Cord Insertion:  Visualized, central
Amniotic Fluid
AFI FV:      Subjectively within normal limits

Largest Pocket(cm)
3.93
Biometry

BPD:      45.6  mm     G. Age:  19w 5d         72  %    CI:        73.65   %    70 - 86
FL/HC:      18.4   %    16.1 -
HC:      168.8  mm     G. Age:  19w 4d         54  %    HC/AC:      1.09        1.09 -
AC:      154.4  mm     G. Age:  20w 4d         84  %    FL/BPD:     68.0   %
FL:         31  mm     G. Age:  19w 4d         55  %    FL/AC:      20.1   %    20 - 24
HUM:      32.5  mm     G. Age:  20w 6d         94  %
CER:      19.8  mm     G. Age:  18w 6d         42  %
NFT:       3.9  mm
CM:        4.2  mm

Est. FW:     331  gm    0 lb 12 oz      58  %
Gestational Age

U/S Today:     19w 6d                                        EDD:   12/29/17
Best:          19w 2d     Det. By:  Previous Ultrasound      EDD:   01/02/18
(05/26/17)
Anatomy

Cranium:               Appears normal         Aortic Arch:            Appears normal
Cavum:                 Appears normal         Ductal Arch:            Appears normal
Ventricles:            Appears normal         Diaphragm:              Appears normal
Choroid Plexus:        Appears normal         Stomach:                Appears normal, left
sided
Cerebellum:            Appears normal         Abdomen:                Appears normal
Posterior Fossa:       Appears normal         Abdominal Wall:         Appears nml (cord
insert, abd wall)
Nuchal Fold:           Appears normal         Cord Vessels:           Appears normal (3
vessel cord)
Face:                  Appears normal         Kidneys:                Bilateral UTD A1; 5
(orbits and profile)
mms
Lips:                  Appears normal         Bladder:                Appears normal
Thoracic:              Appears normal         Spine:                  Ltd views no
intracranial signs of
NT
Heart:                 Appears normal         Upper Extremities:      Appears normal
(4CH, axis, and situs
RVOT:                  Appears normal         Lower Extremities:      Clubfoot Lt
LVOT:                  Appears normal

Other:  Male gender. Rt 5th digit visualized. Rt Heel appears normal.
Technically difficult due to maternal habitus and fetal position.
Cervix Uterus Adnexa

Cervix
Length:            4.2  cm.
Normal appearance by transabdominal scan.

Uterus
No abnormality visualized.
Impression

SIUP at 19+2 weeks
Talipes equinovarus (clubfoot) on left; normal right foot;
bilateral UTD A1 - renal pelves 5 mms
All other detailed fetal anatomy was seen and appeared
normal
Markers of aneuploidy: none
Normal amniotic fluid volume
Measurements consistent with prior US

The US findings were shared with Ms. Erxleben and her husband.
The implications of a clubfoot and UTD A1 were discussed in
detail. All of their prenatal testing and pregnancy
management options were reviewed. After careful
consideration, she declined amniocentesis and cell free DNA
screening. We also discussed the option of having a prenatal
pediatric orthopedic consult.
Recommendations

Follow-up ultrasound after 28 weeks to reassess the kidneys
(< 7 mms normal for renal pelvis)
If she would like a peds ortho consult, we can help - please
call us or call [[email or web address] for an appt with Dr. Vietinis Bason

## 2021-07-22 ENCOUNTER — Ambulatory Visit
Admission: RE | Admit: 2021-07-22 | Discharge: 2021-07-22 | Disposition: A | Discharge status: Home / Self Care | Payer: 59 | Source: Ambulatory Visit | Attending: Nurse Practitioner | Admitting: Nurse Practitioner

## 2021-07-22 ENCOUNTER — Other Ambulatory Visit: Payer: Self-pay

## 2021-07-22 VITALS — BP 128/86 | HR 93 | Temp 98.3°F | Resp 16

## 2021-07-22 DIAGNOSIS — H60392 Other infective otitis externa, left ear: Secondary | ICD-10-CM | POA: Diagnosis not present

## 2021-07-22 MED ORDER — NEOMYCIN-POLYMYXIN-DEXAMETH 3.5-10000-0.1 OP SUSP: 2.0000 [drp] | OPHTHALMIC | 0 refills | Status: AC

## 2021-07-22 MED ORDER — AMOXICILLIN 875 MG PO TABS: 875.0000 mg | ORAL_TABLET | Freq: Two times a day (BID) | ORAL | 0 refills | Status: DC

## 2021-07-22 NOTE — Discharge Instructions (Addendum)
Take medications as prescribed  Do not get water in the ear  Do not put anything in your ear   Get help right away if: You develop a fever. Your redness, swelling, or pain gets worse. You develop a very bad headache. You develop redness, swelling, pain or tenderness behind your ear.

## 2021-07-22 NOTE — ED Provider Notes (Signed)
[facility]    CSN: [id number] Arrival date & time: 07/22/21  1224      History   Chief Complaint Chief Complaint  Patient presents with   Otalgia   Appt 1 pm    HPI Kelly Ellison is a 31 y.o. female.   Subjective:   Kelly Ellison is a 31 y.o. female who presents for possible ear infection. Symptoms include left ear pain. Onset of symptoms was 2 days ago after cleaning her ear out. Symptoms have been gradually worsening since onset. She denies any fevers, myalgias,  congestion, facial pain, headache, sore throat, or tooth pain. She has been alternating between tylenol and ibuprofen for the pain.  She is drinking plenty of fluids  The following portions of the patient's history were reviewed and updated as appropriate: allergies, current medications, past family history, past medical history, past social history, past surgical history, and problem list.      Past Medical History:  Diagnosis Date   Allergy    Anemia    Asthma    inhaler prn   History of chicken pox    age 73   History of gestational hypertension    PCOS (polycystic ovarian syndrome)    Vaginal Pap smear, abnormal     Patient Active Problem List   Diagnosis Date Noted   Vaginal delivery 12/23/2017   Indication for care in labor or delivery 12/22/2017   Traumatic injury during pregnancy in third trimester 11/28/2017   Asthma with acute exacerbation 10/08/2014   PCOS (polycystic ovarian syndrome) 10/08/2014   Fever blister 12/22/2011   ELEVATED BLOOD PRESSURE WITHOUT DIAGNOSIS OF HYPERTENSION 08/13/2009   ALLERGIC RHINITIS 10/03/2007   ASTHMA, PERSISTENT, MODERATE 08/24/2007    Past Surgical History:  Procedure Laterality Date   CYSTECTOMY     left elbow   EYE SURGERY     young age   HYMENECTOMY     SKIN TAG REMOVAL     vaginal    OB History     Gravida  2   Para  2   Term  2   Preterm      AB      Living  2      SAB      IAB      Ectopic      Multiple   0   Live Births  1            Home Medications    Prior to Admission medications   Medication Sig Start Date End Date Taking? Authorizing Provider  amoxicillin (AMOXIL) 875 MG tablet Take 1 tablet (875 mg total) by mouth 2 (two) times daily. 07/22/21  Yes Lurline Idol, FNP  neomycin-polymyxin b-dexamethasone (MAXITROL) 3.5-10000-0.1 SUSP Place 2 drops into the left eye every 4 (four) hours for 7 days. 07/22/21 07/29/21 Yes Lurline Idol, FNP  acetaminophen (TYLENOL) 325 MG tablet Take 2 tablets (650 mg total) by mouth every 6 (six) hours as needed (for pain scale < 4). 12/23/17   Harold Hedge, MD  albuterol (PROVENTIL HFA;VENTOLIN HFA) 108 (90 BASE) MCG/ACT inhaler Inhale 2 puffs into the lungs every 6 (six) hours as needed for wheezing or shortness of breath. 10/08/14   Saguier, Ramon Dredge, PA-C  ferrous sulfate (FERROUSUL) 325 (65 FE) MG tablet Take 1 tablet (325 mg total) by mouth daily with breakfast. 12/23/17   Harold Hedge, MD  ibuprofen (ADVIL,MOTRIN) 600 MG tablet Take 1 tablet (600 mg total) by mouth every 6 (six)  hours as needed. 12/23/17   Harold Hedgeomblin, James, MD  metFORMIN (GLUCOPHAGE) 500 MG tablet Take 500 mg by mouth 2 (two) times daily with a meal.     [provider]  Prenatal MV-Min-FA-Omega-3 (PRENATAL GUMMIES/DHA & FA PO) Take 2 capsules by mouth daily.    [provider]    Family History Family History  Problem Relation Age of Onset   Arthritis Mother        maternal grandmother   Thyroid disease Mother    Migraines Mother    Diabetes Maternal Grandmother        brohter   Hypertension Maternal Grandmother    Thyroid disease Maternal Grandmother    Migraines Maternal Grandmother    Rheum arthritis Maternal Grandmother    Migraines Maternal Uncle    Muscular dystrophy Paternal Aunt    Breast cancer Neg Hx    Prostate cancer Neg Hx    Colon cancer Neg Hx    Heart disease Neg Hx     Social History Social History   Tobacco Use   Smoking  status: Never   Smokeless tobacco: Never  Substance Use Topics   Alcohol use: No    Alcohol/week: 0.0 standard drinks   Drug use: No     Allergies   Patient has no known allergies.   Review of Systems Review of Systems  Constitutional:  Negative for fever.  HENT:  Positive for ear pain. Negative for congestion, ear discharge, facial swelling, hearing loss, rhinorrhea, sore throat and tinnitus.   Respiratory:  Negative for cough.   Neurological:  Negative for headaches.  All other systems reviewed and are negative.   Physical Exam Triage Vital Signs ED Triage Vitals  Enc Vitals Group     BP 07/22/21 1305 128/86     Pulse Rate 07/22/21 1305 93     Resp 07/22/21 1305 16     Temp 07/22/21 1305 98.3 F (36.8 C)     Temp Source 07/22/21 1305 Oral     SpO2 07/22/21 1305 98 %     Weight --      Height --      Head Circumference --      Peak Flow --      Pain Score 07/22/21 1306 8     Pain Loc --      Pain Edu? --      Excl. in [place]? --    No data found.  Updated Vital Signs BP 128/86 (BP Location: Right Arm)    Pulse 93    Temp 98.3 F (36.8 C) (Oral)    Resp 16    SpO2 98%   Visual Acuity Right Eye Distance:   Left Eye Distance:   Bilateral Distance:    Right Eye Near:   Left Eye Near:    Bilateral Near:     Physical Exam Vitals reviewed.  Constitutional:      Appearance: Normal appearance. She is normal weight.  HENT:     Head: Normocephalic.     Right Ear: Hearing, tympanic membrane, ear canal and external ear normal.     Left Ear: Hearing normal. Swelling and tenderness present. No drainage. No mastoid tenderness.     Nose: Nose normal.  Eyes:     Conjunctiva/sclera: Conjunctivae normal.  Cardiovascular:     Rate and Rhythm: Normal rate.  Pulmonary:     Effort: Pulmonary effort is normal.  Musculoskeletal:        General: Normal range of motion.  Cervical back: Normal range of motion.  Skin:    General: Skin is warm and dry.  Neurological:      General: No focal deficit present.     Mental Status: She is alert and oriented to person, place, and time.     UC Treatments / Results  Labs (all labs ordered are listed, but only abnormal results are displayed) Labs Reviewed - No data to display  EKG   Radiology No results found.  Procedures Procedures (including critical care time)  Medications Ordered in UC Medications - No data to display  Initial Impression / Assessment and Plan / UC Course  I have reviewed the triage vital signs and the nursing notes.  Pertinent labs & imaging results that were available during my care of the patient were reviewed by me and considered in my medical decision making (see chart for details).   31 year old female with left otalgia has been gradually worsening over the past couple of days.  No fevers, myalgias, congestion, facial pain, headache, sore throat or dental issues.  He is afebrile.  Uncomfortable but nontoxic.  Canal swelling and tenderness present.  Unable to visualize TM due to the swelling.  No mastoid tenderness noted.  1.  Treatment with Amoxicillin and Maxitrol drops 2.  OTC analgesics, fluids, rest.  3.  Follow up with ENT if not improving 4.  Discussed indications for immediate ED evaluation  Today's evaluation has revealed no signs of a dangerous process. Discussed diagnosis with patient and/or guardian. Patient and/or guardian aware of their diagnosis, possible red flag symptoms to watch out for and need for close follow up. Patient and/or guardian understands verbal and written discharge instructions. Patient and/or guardian comfortable with plan and disposition.  Patient and/or guardian has a clear mental status at this time, good insight into illness (after discussion and teaching) and has clear judgment to make decisions regarding their care  This care was provided during an unprecedented National Emergency due to the Novel Coronavirus (COVID-19) pandemic. COVID-19  infections and transmission risks place heavy strains on healthcare resources.  As this pandemic evolves, our facility, providers, and staff strive to respond fluidly, to remain operational, and to provide care relative to available resources and information. Outcomes are unpredictable and treatments are without well-defined guidelines. Further, the impact of COVID-19 on all aspects of urgent care, including the impact to patients seeking care for reasons other than COVID-19, is unavoidable during this national emergency. At this time of the global pandemic, management of patients has significantly changed, even for non-COVID positive patients given high local and regional COVID volumes at this time requiring high healthcare system and resource utilization. The standard of care for management of both COVID suspected and non-COVID suspected patients continues to change rapidly at the local, regional, national, and global levels. This patient was worked up and treated to the best available but ever changing evidence and resources available at this current time.   Documentation was completed with the aid of voice recognition software. Transcription may contain typographical errors. Final Clinical Impressions(s) / UC Diagnoses   Final diagnoses:  Other infective acute otitis externa of left ear     Discharge Instructions      Take medications as prescribed  Do not get water in the ear  Do not put anything in your ear   Get help right away if: You develop a fever. Your redness, swelling, or pain gets worse. You develop a very bad headache. You develop redness, swelling, pain  or tenderness behind your ear.     ED Prescriptions     Medication Sig Dispense Auth. Provider   amoxicillin (AMOXIL) 875 MG tablet Take 1 tablet (875 mg total) by mouth 2 (two) times daily. 14 tablet Zeinab Rodwell, [place], FNP   neomycin-polymyxin b-dexamethasone (MAXITROL) 3.5-10000-0.1 SUSP Place 2 drops into the left eye  every 4 (four) hours for 7 days. 4.2 mL Lurline Idol, FNP      PDMP not reviewed this encounter.   Lurline Idol, [place] 07/22/21 1357

## 2021-07-22 NOTE — ED Triage Notes (Signed)
Left ear pain since Tuesday after cleaning it.

## 2022-05-02 ENCOUNTER — Other Ambulatory Visit (HOSPITAL_COMMUNITY): Payer: Self-pay

## 2022-05-02 MED ORDER — HYDROCODONE-ACETAMINOPHEN 7.5-325 MG PO TABS
1.0000 | ORAL_TABLET | Freq: Four times a day (QID) | ORAL | 0 refills | Status: DC | PRN
  Filled 2022-05-02: qty 14, 4d supply, fill #0

## 2022-05-02 MED ORDER — CLINDAMYCIN HCL 300 MG PO CAPS
300.0000 mg | ORAL_CAPSULE | Freq: Two times a day (BID) | ORAL | 0 refills | Status: AC
  Filled 2022-05-02: qty 10, 5d supply, fill #0

## 2022-06-06 ENCOUNTER — Other Ambulatory Visit (HOSPITAL_COMMUNITY): Payer: Self-pay

## 2022-06-06 MED ORDER — METFORMIN HCL 500 MG PO TABS
500.0000 mg | ORAL_TABLET | Freq: Two times a day (BID) | ORAL | 1 refills | Status: DC
  Filled 2022-06-06: qty 60, 30d supply, fill #0
  Filled 2022-07-15: qty 60, 30d supply, fill #1

## 2022-07-04 ENCOUNTER — Other Ambulatory Visit (HOSPITAL_COMMUNITY): Payer: Self-pay

## 2022-07-04 MED ORDER — LEVONORGEST-ETH ESTRADIOL-IRON 0.1-20 MG-MCG(21) PO TABS
1.0000 | ORAL_TABLET | Freq: Every day | ORAL | 2 refills | Status: DC
  Filled 2022-07-04 (×3): qty 28, 28d supply, fill #0

## 2022-07-06 ENCOUNTER — Other Ambulatory Visit (HOSPITAL_COMMUNITY): Payer: Self-pay

## 2022-07-06 MED ORDER — LEVONORGESTREL-ETHINYL ESTRAD 0.1-20 MG-MCG PO TABS
1.0000 | ORAL_TABLET | Freq: Every day | ORAL | 3 refills | Status: DC
  Filled 2022-07-06: qty 28, 28d supply, fill #0
  Filled 2022-09-04: qty 28, 28d supply, fill #1
  Filled 2022-10-02: qty 28, 28d supply, fill #2

## 2022-07-08 ENCOUNTER — Other Ambulatory Visit (HOSPITAL_COMMUNITY): Payer: Self-pay

## 2022-07-15 ENCOUNTER — Other Ambulatory Visit (HOSPITAL_COMMUNITY): Payer: Self-pay

## 2022-08-12 ENCOUNTER — Other Ambulatory Visit (HOSPITAL_COMMUNITY): Payer: Self-pay

## 2022-08-12 MED ORDER — LEVONORGESTREL-ETHINYL ESTRAD 0.1-20 MG-MCG PO TABS
1.0000 | ORAL_TABLET | Freq: Every day | ORAL | 2 refills | Status: DC
  Filled 2022-08-12: qty 28, 28d supply, fill #0

## 2022-08-12 MED ORDER — METFORMIN HCL 500 MG PO TABS
500.0000 mg | ORAL_TABLET | Freq: Two times a day (BID) | ORAL | 1 refills | Status: DC
  Filled 2022-08-12: qty 60, 30d supply, fill #0
  Filled 2022-09-04: qty 60, 30d supply, fill #1

## 2022-08-31 ENCOUNTER — Ambulatory Visit
Admission: EM | Admit: 2022-08-31 | Discharge: 2022-08-31 | Disposition: A | Discharge status: Home / Self Care | Payer: 59 | Attending: Emergency Medicine | Admitting: Emergency Medicine

## 2022-08-31 DIAGNOSIS — H60391 Other infective otitis externa, right ear: Secondary | ICD-10-CM | POA: Diagnosis not present

## 2022-08-31 MED ORDER — NEOMYCIN-POLYMYXIN-DEXAMETH 3.5-10000-0.1 OP SUSP: 1.0000 [drp] | OPHTHALMIC | 0 refills | Status: DC

## 2022-08-31 MED ORDER — AMOXICILLIN 875 MG PO TABS: 875.0000 mg | ORAL_TABLET | Freq: Two times a day (BID) | ORAL | 0 refills | Status: DC

## 2022-08-31 NOTE — ED Triage Notes (Signed)
Pt presents with right ear fullness and pain X 2 days.

## 2022-08-31 NOTE — ED Provider Notes (Addendum)
[facility]    CSN: [id number] Arrival date & time: 08/31/22  [id number]      History   Chief Complaint Chief Complaint  Patient presents with   Ear Fullness   Otalgia    HPI Kelly Ellison is a 32 y.o. female.   Patient presents for evaluation of right ear fullness and pain beginning 2 days ago.  Noticed green to yellow drainage with foul odor and had decreased hearing.  Has not attempted treatment of symptoms.  Denies fever or congestion.    Past Medical History:  Diagnosis Date   Allergy    Anemia    Asthma    inhaler prn   History of chicken pox    age 53   History of gestational hypertension    PCOS (polycystic ovarian syndrome)    Vaginal Pap smear, abnormal     Patient Active Problem List   Diagnosis Date Noted   Vaginal delivery 12/23/2017   Indication for care in labor or delivery 12/22/2017   Traumatic injury during pregnancy in third trimester 11/28/2017   Asthma with acute exacerbation 10/08/2014   PCOS (polycystic ovarian syndrome) 10/08/2014   Fever blister 12/22/2011   ELEVATED BLOOD PRESSURE WITHOUT DIAGNOSIS OF HYPERTENSION 08/13/2009   ALLERGIC RHINITIS 10/03/2007   ASTHMA, PERSISTENT, MODERATE 08/24/2007    Past Surgical History:  Procedure Laterality Date   CYSTECTOMY     left elbow   EYE SURGERY     young age   43     SKIN TAG REMOVAL     vaginal    OB History     Gravida  2   Para  2   Term  2   Preterm      AB      Living  2      SAB      IAB      Ectopic      Multiple  0   Live Births  1            Home Medications    Prior to Admission medications   Medication Sig Start Date End Date Taking? Authorizing Provider  acetaminophen (TYLENOL) 325 MG tablet Take 2 tablets (650 mg total) by mouth every 6 (six) hours as needed (for pain scale < 4). 12/23/17   Everlene Farrier, MD  albuterol (PROVENTIL HFA;VENTOLIN HFA) 108 (90 BASE) MCG/ACT inhaler Inhale 2 puffs into the lungs every 6 (six)  hours as needed for wheezing or shortness of breath. 10/08/14   Saguier, Percell Miller, PA-C  amoxicillin (AMOXIL) 875 MG tablet Take 1 tablet (875 mg total) by mouth 2 (two) times daily. 07/22/21   Enrique Sack, FNP  clindamycin (CLEOCIN) 300 MG capsule Take 1 capsule (300 mg total) by mouth every 12 (twelve) hours. 05/02/22     ferrous sulfate (FERROUSUL) 325 (65 FE) MG tablet Take 1 tablet (325 mg total) by mouth daily with breakfast. 12/23/17   Everlene Farrier, MD  HYDROcodone-acetaminophen (NORCO) 7.5-325 MG tablet Take 1 tablet by mouth every 6 (six) hours as needed for pain 05/02/22     ibuprofen (ADVIL,MOTRIN) 600 MG tablet Take 1 tablet (600 mg total) by mouth every 6 (six) hours as needed. 12/23/17   Everlene Farrier, MD  levonorgestrel-ethinyl estradiol (LESSINA-28) 0.1-20 MG-MCG tablet Take 1 tablet by mouth daily. 07/06/22     levonorgestrel-ethinyl estradiol (LESSINA-28) 0.1-20 MG-MCG tablet Take 1 tablet every day by mouth at the same time daily 28 days. 08/12/22  metFORMIN (GLUCOPHAGE) 500 MG tablet Take 500 mg by mouth 2 (two) times daily with a meal.     [provider]  metFORMIN (GLUCOPHAGE) 500 MG tablet TAKE 1 TABLET BY MOUTH TWICE A DAY WITH FOOD 08/12/22     Prenatal MV-Min-FA-Omega-3 (PRENATAL GUMMIES/DHA & FA PO) Take 2 capsules by mouth daily.    [provider]    Family History Family History  Problem Relation Age of Onset   Arthritis Mother        maternal grandmother   Thyroid disease Mother    Migraines Mother    Diabetes Maternal Grandmother        brohter   Hypertension Maternal Grandmother    Thyroid disease Maternal Grandmother    Migraines Maternal Grandmother    Rheum arthritis Maternal Grandmother    Migraines Maternal Uncle    Muscular dystrophy Paternal Aunt    Breast cancer Neg Hx    Prostate cancer Neg Hx    Colon cancer Neg Hx    Heart disease Neg Hx     Social History Social History   Tobacco Use   Smoking status: Never    Smokeless tobacco: Never  Substance Use Topics   Alcohol use: No    Alcohol/week: 0.0 standard drinks of alcohol   Drug use: No     Allergies   Patient has no known allergies.   Review of Systems Review of Systems  Constitutional: Negative.   HENT:  Positive for ear pain. Negative for congestion, dental problem, drooling, ear discharge, facial swelling, hearing loss, mouth sores, nosebleeds, postnasal drip, rhinorrhea, sinus pressure, sinus pain, sneezing, sore throat, tinnitus, trouble swallowing and voice change.   Respiratory: Negative.    Cardiovascular: Negative.   Gastrointestinal: Negative.      Physical Exam Triage Vital Signs ED Triage Vitals [08/31/22 0931]  Enc Vitals Group     BP (!) 142/95     Pulse Rate (!) 101     Resp 17     Temp 98.4 F (36.9 C)     Temp Source Oral     SpO2 98 %     Weight      Height      Head Circumference      Peak Flow      Pain Score 5     Pain Loc      Pain Edu?      Excl. in [place]?    No data found.  Updated Vital Signs BP (!) 142/95 (BP Location: Left Arm)   Pulse (!) 101   Temp 98.4 F (36.9 C) (Oral)   Resp 17   SpO2 98%   Visual Acuity Right Eye Distance:   Left Eye Distance:   Bilateral Distance:    Right Eye Near:   Left Eye Near:    Bilateral Near:     Physical Exam Constitutional:      Appearance: Normal appearance.  HENT:     Left Ear: Tympanic membrane, ear canal and external ear normal.     Ears:     Comments: Mild edema and scant drainage noted to the right ear canal, no abnormalities to the tympanic membrane Eyes:     Extraocular Movements: Extraocular movements intact.  Pulmonary:     Effort: Pulmonary effort is normal.  Neurological:     Mental Status: She is alert and oriented to person, place, and time. Mental status is at baseline.      UC Treatments / Results  Labs (all labs ordered are listed, but only abnormal results are displayed) Labs Reviewed - No data to  display  EKG   Radiology No results found.  Procedures Procedures (including critical care time)  Medications Ordered in UC Medications - No data to display  Initial Impression / Assessment and Plan / UC Course  I have reviewed the triage vital signs and the nursing notes.  Pertinent labs & imaging results that were available during my care of the patient were reviewed by me and considered in my medical decision making (see chart for details).  Infective otitis externa of right ear  Presentation and symptomology is consistent with above diagnosis, discussed with patient, has had success with oral amoxicillin during last infection in January 2023 therefore represcribed, recommend over-the-counter analgesics for comfort as well as warm compresses to the external ear, advised against ear cleaning to prevent further irritation, may follow-up with his urgent care as needed if symptoms persist or worsen Final Clinical Impressions(s) / UC Diagnoses   Final diagnoses:  None   Discharge Instructions   None    ED Prescriptions   None    PDMP not reviewed this encounter.   Hans Eden, NP 08/31/22 0944    Hans Eden, NP 08/31/22 [phone]

## 2022-08-31 NOTE — Discharge Instructions (Addendum)
Today you are being treated for an infection of the ear canal   Take amoxicillin twice daily for 7 days, you should begin to see improvement after 48 hours of medication use and then it should progressively get better  You may use Tylenol or ibuprofen for management of discomfort  May hold warm compresses to the ear for additional comfort  Please not attempted any ear cleaning or object or fluid placement into the ear canal to prevent further irritation

## 2022-09-06 ENCOUNTER — Other Ambulatory Visit (HOSPITAL_COMMUNITY): Payer: Self-pay

## 2022-09-06 LAB — HM PAP SMEAR: HM Pap smear: NEGATIVE

## 2022-09-06 MED ORDER — LEVONORGESTREL-ETHINYL ESTRAD 0.1-20 MG-MCG PO TABS
1.0000 | ORAL_TABLET | Freq: Every day | ORAL | 11 refills | Status: DC
  Filled 2022-09-06 – 2022-11-01 (×2): qty 28, 28d supply, fill #0
  Filled 2022-11-29: qty 28, 28d supply, fill #1
  Filled 2022-12-27: qty 28, 28d supply, fill #2
  Filled 2023-01-24: qty 28, 28d supply, fill #3
  Filled 2023-02-21: qty 28, 28d supply, fill #4
  Filled 2023-03-21 (×3): qty 28, 28d supply, fill #5
  Filled 2023-04-18: qty 28, 28d supply, fill #6
  Filled 2023-05-16: qty 28, 28d supply, fill #7
  Filled 2023-06-13: qty 28, 28d supply, fill #8
  Filled 2023-07-10: qty 28, 28d supply, fill #9
  Filled 2023-09-04: qty 28, 28d supply, fill #11

## 2022-10-06 ENCOUNTER — Other Ambulatory Visit (HOSPITAL_COMMUNITY): Payer: Self-pay

## 2022-10-06 MED ORDER — METFORMIN HCL 500 MG PO TABS
500.0000 mg | ORAL_TABLET | Freq: Two times a day (BID) | ORAL | 11 refills | Status: DC
  Filled 2022-10-06: qty 60, 30d supply, fill #0
  Filled 2022-11-14: qty 60, 30d supply, fill #1
  Filled 2022-12-14: qty 60, 30d supply, fill #2
  Filled 2023-01-12 – 2023-01-26 (×5): qty 60, 30d supply, fill #3
  Filled 2023-02-21: qty 60, 30d supply, fill #4
  Filled 2023-03-21 (×2): qty 60, 30d supply, fill #5
  Filled 2023-04-24: qty 60, 30d supply, fill #6
  Filled 2023-06-01: qty 60, 30d supply, fill #7
  Filled 2023-07-06: qty 60, 30d supply, fill #8
  Filled 2023-09-04: qty 60, 30d supply, fill #10
  Filled 2023-09-26: qty 60, 30d supply, fill #11

## 2022-10-13 ENCOUNTER — Ambulatory Visit: Payer: 59 | Admitting: Nurse Practitioner

## 2022-10-13 ENCOUNTER — Other Ambulatory Visit (HOSPITAL_COMMUNITY): Payer: Self-pay

## 2022-10-13 ENCOUNTER — Encounter: Payer: Self-pay | Admitting: Nurse Practitioner

## 2022-10-13 VITALS — BP 130/90 | HR 100 | Ht 68.0 in | Wt 248.0 lb

## 2022-10-13 DIAGNOSIS — R768 Other specified abnormal immunological findings in serum: Secondary | ICD-10-CM

## 2022-10-13 DIAGNOSIS — J4540 Moderate persistent asthma, uncomplicated: Secondary | ICD-10-CM | POA: Diagnosis not present

## 2022-10-13 DIAGNOSIS — R030 Elevated blood-pressure reading, without diagnosis of hypertension: Secondary | ICD-10-CM | POA: Diagnosis not present

## 2022-10-13 DIAGNOSIS — Z6837 Body mass index (BMI) 37.0-37.9, adult: Secondary | ICD-10-CM

## 2022-10-13 DIAGNOSIS — R7989 Other specified abnormal findings of blood chemistry: Secondary | ICD-10-CM

## 2022-10-13 DIAGNOSIS — E559 Vitamin D deficiency, unspecified: Secondary | ICD-10-CM

## 2022-10-13 DIAGNOSIS — E282 Polycystic ovarian syndrome: Secondary | ICD-10-CM

## 2022-10-13 DIAGNOSIS — M329 Systemic lupus erythematosus, unspecified: Secondary | ICD-10-CM

## 2022-10-13 DIAGNOSIS — R21 Rash and other nonspecific skin eruption: Secondary | ICD-10-CM

## 2022-10-13 LAB — CBC WITH DIFFERENTIAL/PLATELET
Basos: 0 %
Eos: 2 %
Hematocrit: 42.2 % (ref 34.0–46.6)
Immature Grans (Abs): 0 10*3/uL (ref 0.0–0.1)
Lymphocytes Absolute: 2.3 10*3/uL (ref 0.7–3.1)
Lymphs: 28 %
MCHC: 31.5 g/dL (ref 31.5–35.7)
Monocytes: 5 %
Neutrophils: 65 %
Platelets: 371 10*3/uL (ref 150–450)

## 2022-10-13 LAB — IRON,TIBC AND FERRITIN PANEL

## 2022-10-13 LAB — VITAMIN B12

## 2022-10-13 MED ORDER — ALBUTEROL SULFATE HFA 108 (90 BASE) MCG/ACT IN AERS
1.0000 | INHALATION_SPRAY | RESPIRATORY_TRACT | 6 refills | Status: AC | PRN
Start: 2022-10-13 — End: ?
  Filled 2022-10-13: qty 6.7, 17d supply, fill #0
  Filled 2022-10-21: qty 6.7, 25d supply, fill #0
  Filled 2023-09-26: qty 6.7, 25d supply, fill #1

## 2022-10-13 NOTE — Patient Instructions (Addendum)
I have sent a referral to rheumatology for you. They will call you to set up a time for evaluation.   Levocitirizine (xyzal) is a great allergy medication to try.

## 2022-10-13 NOTE — Progress Notes (Signed)
Tollie Eth, DNP, AGNP-c [facility] [address] [place], [place] [address] Main Office [phone]   New patient visit   Patient: Kelly Ellison   DOB: 02/01/1991   32 y.o. Female  MRN: [id number] Visit Date: 10/13/2022  Patient Care Team: Tollie Eth, NP as PCP - General (Nurse Practitioner)  Today's Vitals   10/13/22 1107  BP: (!) 130/90  Pulse: 100  Weight: 248 lb (112.5 kg)  Height: 5\' 8"  (1.727 m)   Body mass index is 37.71 kg/m.   Today's healthcare provider: Tollie Eth, NP   Chief Complaint  Patient presents with   Establish Care    Pt. Would like to establish care. No other concerns. Sees. Dr. Perlie Gold at [facility]. Got a pap last month.    Subjective    Kelly Ellison is a 32 y.o. female who presents today as a new patient to establish care.    Kelly Ellison presents today to establish care. She reports a history of hypertension, but states that her blood pressure was normal at her weight loss appointment last week. She is currently attending a bariatric clinic and has lost significant weight, going from the 290s to the 240s.   Kelly Ellison has a history of reflux, asthma, and polycystic ovary syndrome (PCOS). She states that her asthma is well-controlled, but she has been using her grandmother's albuterol inhaler and would like a prescription for her own. Her PCOS symptoms are managed with birth control, which has normalized her periods.  Kelly Ellison has two children, aged 63 and 4. Kelly Ellison's mother has a diagnosis of Hashimoto's thyroiditis, and Kelly Ellison is concerned about potential thyroid issues for herself. She has a skin issue that has been diagnosed as keratosis pilaris by one dermatologist and a low positive for lupus by another. She experiences red flare-ups on her arms when exposed to heat or sunlight, which she feels could be a lupus erythematosus type reaction.  Recent lab results for Kelly Ellison showed a TSH of 1.25, thyroxine at 10.8, T3  uptake at 24, and free thyroxine at 2.6. Her CBC results were mostly normal, with a slightly low MCH at 26.3 and RDW at 15.2. The chloride level was 107, and carbon dioxide was 19. Kelly Ellison does not experience joint pain or swelling. She has seen two dermatologists for her skin issue, but neither performed blood work specifically for the flare-ups.  History reviewed and reveals the following: Past Medical History:  Diagnosis Date   Allergy    Anemia    Asthma    inhaler prn   Asthma with acute exacerbation 10/08/2014   GERD (gastroesophageal reflux disease)    With sauces or high acid contents   History of chicken pox    age 6   History of gestational hypertension    Indication for care in labor or delivery 12/22/2017   PCOS (polycystic ovarian syndrome)    Vaginal delivery 12/23/2017   Vaginal Pap smear, abnormal    Past Surgical History:  Procedure Laterality Date   CYSTECTOMY     left elbow   EYE SURGERY     young age   HYMENECTOMY     SKIN TAG REMOVAL     vaginal   Family Status  Relation Name Status   Mother Victorino Dike Alive   Father Kween Bacorn Alive   MGM Rodrigo Ran (Not Specified)   Mat Uncle  (Not Specified)   Emelda Brothers  (Not Specified)   Son Pascal Lux  Carlena Sax (Not Specified)   Neg Hx  (Not Specified)   Family History  Problem Relation Age of Onset   Arthritis Mother        maternal grandmother   Thyroid disease Mother    Migraines Mother    ADD / ADHD Mother    Asthma Father    Hypertension Father    Diabetes Maternal Grandmother        brohter   Hypertension Maternal Grandmother    Thyroid disease Maternal Grandmother    Migraines Maternal Grandmother    Rheum arthritis Maternal Grandmother    Heart disease Maternal Grandmother    Kidney disease Maternal Grandmother    Obesity Maternal Grandmother    Varicose Veins Maternal Grandmother    Migraines Maternal Uncle    Muscular dystrophy Paternal Aunt    ADD / ADHD Son    Breast cancer Neg Hx     Prostate cancer Neg Hx    Colon cancer Neg Hx    Social History   Socioeconomic History   Marital status: Married    Spouse name: Not on file   Number of children: Not on file   Years of education: Not on file   Highest education level: Not on file  Occupational History   Not on file  Tobacco Use   Smoking status: Never   Smokeless tobacco: Never  Substance and Sexual Activity   Alcohol use: No   Drug use: No   Sexual activity: Yes    Birth control/protection: Pill  Other Topics Concern   Not on file  Social History Narrative   Not on file   Social Determinants of Health   Financial Resource Strain: Not on file  Food Insecurity: Not on file  Transportation Needs: Not on file  Physical Activity: Not on file  Stress: Not on file  Social Connections: Not on file   Outpatient Medications Prior to Visit  Medication Sig Note   Choline-Inositol-Methionine (LIPO IM) Inject into the muscle. Once a month.    levonorgestrel-ethinyl estradiol (ALESSE) 0.1-20 MG-MCG tablet Take 1 tablet by mouth daily at the same time each day as directed.    metFORMIN (GLUCOPHAGE) 500 MG tablet Take 1 tablet (500 mg total) by mouth 2 (two) times daily with a meal.    phentermine 37.5 MG capsule Take 37.5 mg by mouth every morning.    [DISCONTINUED] albuterol (PROVENTIL HFA;VENTOLIN HFA) 108 (90 BASE) MCG/ACT inhaler Inhale 2 puffs into the lungs every 6 (six) hours as needed for wheezing or shortness of breath.    [DISCONTINUED] levonorgestrel-ethinyl estradiol (LESSINA-28) 0.1-20 MG-MCG tablet Take 1 tablet by mouth daily.    [DISCONTINUED] levonorgestrel-ethinyl estradiol (LESSINA-28) 0.1-20 MG-MCG tablet Take 1 tablet every day by mouth at the same time daily 28 days.    [DISCONTINUED] metFORMIN (GLUCOPHAGE) 500 MG tablet Take 500 mg by mouth 2 (two) times daily with a meal.     clindamycin (CLEOCIN) 300 MG capsule Take 1 capsule (300 mg total) by mouth every 12 (twelve) hours. (Patient not  taking: Reported on 10/13/2022)    [DISCONTINUED] acetaminophen (TYLENOL) 325 MG tablet Take 2 tablets (650 mg total) by mouth every 6 (six) hours as needed (for pain scale < 4). (Patient not taking: Reported on 10/13/2022) 10/13/2022: PRN   [DISCONTINUED] amoxicillin (AMOXIL) 875 MG tablet Take 1 tablet (875 mg total) by mouth 2 (two) times daily.    [DISCONTINUED] ferrous sulfate (FERROUSUL) 325 (65 FE) MG tablet Take 1 tablet (325 mg total) by  mouth daily with breakfast. (Patient not taking: Reported on 10/13/2022)    [DISCONTINUED] HYDROcodone-acetaminophen (NORCO) 7.5-325 MG tablet Take 1 tablet by mouth every 6 (six) hours as needed for pain (Patient not taking: Reported on 10/13/2022)    [DISCONTINUED] ibuprofen (ADVIL,MOTRIN) 600 MG tablet Take 1 tablet (600 mg total) by mouth every 6 (six) hours as needed. (Patient not taking: Reported on 10/13/2022)    [DISCONTINUED] Prenatal MV-Min-FA-Omega-3 (PRENATAL GUMMIES/DHA & FA PO) Take 2 capsules by mouth daily. (Patient not taking: Reported on 10/13/2022)    No facility-administered medications prior to visit.   No Known Allergies Immunization History  Administered Date(s) Administered   Hepatitis B, PED/ADOLESCENT 03/28/2001, 05/03/2001, 09/13/2001   Moderna Sars-Covid-2 Vaccination 02/23/2020, 03/22/2020    Health Maintenance Due Health Maintenance Topics with due status: Overdue     Topic Date Due   Hepatitis C Screening Never done   DTaP/Tdap/Td Never done   PAP SMEAR-Modifier 05/16/2017    Review of Systems All review of systems negative except what is listed in the HPI   Objective    BP (!) 130/90   Pulse 100   Ht 5\' 8"  (1.727 m)   Wt 248 lb (112.5 kg)   LMP 10/06/2022 (Exact Date)   BMI 37.71 kg/m  Physical Exam Vitals and nursing note reviewed.  Constitutional:      Appearance: Normal appearance. She is obese.  HENT:     Head: Normocephalic and atraumatic.  Eyes:     Extraocular Movements: Extraocular movements  intact.     Pupils: Pupils are equal, round, and reactive to light.  Neck:     Vascular: No carotid bruit.  Cardiovascular:     Rate and Rhythm: Normal rate and regular rhythm.     Pulses: Normal pulses.     Heart sounds: Normal heart sounds. No murmur heard. Pulmonary:     Effort: Pulmonary effort is normal.     Breath sounds: Wheezing present.  Abdominal:     General: Bowel sounds are normal. There is no distension.     Palpations: Abdomen is soft.     Tenderness: There is no abdominal tenderness. There is no right CVA tenderness, left CVA tenderness or guarding.  Musculoskeletal:        General: No swelling or tenderness. Normal range of motion.     Cervical back: Normal range of motion. No tenderness.     Right lower leg: No edema.     Left lower leg: No edema.  Lymphadenopathy:     Cervical: No cervical adenopathy.  Skin:    General: Skin is warm and dry.     Capillary Refill: Capillary refill takes less than 2 seconds.     Findings: No erythema or lesion.     Comments: Keratosis pilaris present to posterior arms bilaterally.   Neurological:     General: No focal deficit present.     Mental Status: She is alert and oriented to person, place, and time.     Sensory: No sensory deficit.     Motor: No weakness.     Coordination: Coordination normal.  Psychiatric:        Mood and Affect: Mood normal.     Results for orders placed or performed in visit on 10/13/22  Vitamin B12  Result Value Ref Range   Vitamin B-12 935 232 - 1,245 pg/mL  Iron, TIBC and Ferritin Panel  Result Value Ref Range   Total Iron Binding Capacity 403 250 - 450 ug/dL  UIBC 353 131 - 425 ug/dL   Iron 50 27 - 161 ug/dL   Iron Saturation 12 (L) 15 - 55 %   Ferritin 17 15 - 150 ng/mL  CBC with Differential/Platelet  Result Value Ref Range   WBC 8.4 3.4 - 10.8 x10E3/uL   RBC 5.14 3.77 - 5.28 x10E6/uL   Hemoglobin 13.3 11.1 - 15.9 g/dL   Hematocrit 09.6 04.5 - 46.6 %   MCV 82 79 - 97 fL   MCH  25.9 (L) 26.6 - 33.0 pg   MCHC 31.5 31.5 - 35.7 g/dL   RDW 40.9 81.1 - 91.4 %   Platelets 371 150 - 450 x10E3/uL   Neutrophils 65 Not Estab. %   Lymphs 28 Not Estab. %   Monocytes 5 Not Estab. %   Eos 2 Not Estab. %   Basos 0 Not Estab. %   Neutrophils Absolute 5.4 1.4 - 7.0 x10E3/uL   Lymphocytes Absolute 2.3 0.7 - 3.1 x10E3/uL   Monocytes Absolute 0.4 0.1 - 0.9 x10E3/uL   EOS (ABSOLUTE) 0.2 0.0 - 0.4 x10E3/uL   Basophils Absolute 0.0 0.0 - 0.2 x10E3/uL   Immature Granulocytes 0 Not Estab. %   Immature Grans (Abs) 0.0 0.0 - 0.1 x10E3/uL  Reticulocytes  Result Value Ref Range   Retic Ct Pct 1.1 0.6 - 2.6 %  TSH+T4F+T3Free+ThyAbs+TPO+VD25  Result Value Ref Range   Vit D, 25-Hydroxy 15.9 (L) 30.0 - 100.0 ng/mL   TSH 1.060 0.450 - 4.500 uIU/mL   T3, Free 3.2 2.0 - 4.4 pg/mL   Free T4 1.31 0.82 - 1.77 ng/dL   Thyroperoxidase Ab SerPl-aCnc 10 0 - 34 IU/mL   Thyroglobulin Antibody <1.0 0.0 - 0.9 IU/mL    Assessment & Plan      Problem List Items Addressed This Visit     ASTHMA, PERSISTENT, MODERATE - Primary    Kelly Ellison experiences asthma exacerbations which she controls with albuterol.  At this time she does not have her own inhaler therefore we will send this into the pharmacy for her today.  Mild wheezing is noted on exam however no shortness of breath no alarm symptoms are present.  Recommend continuation of albuterol. Plan: -Prescription for albuterol has been sent into the pharmacy. -Please let me know if your breathing appears to worsen      Relevant Medications   albuterol (VENTOLIN HFA) 108 (90 Base) MCG/ACT inhaler   Other Relevant Orders   Ambulatory referral to Rheumatology   Vitamin B12 (Completed)   Iron, TIBC and Ferritin Panel (Completed)   CBC with Differential/Platelet (Completed)   Reticulocytes (Completed)   ELEVATED BLOOD PRESSURE WITHOUT DIAGNOSIS OF HYPERTENSION    Blood pressure elevated during visit today. Readings in recent past have been normal.  Suspect this is related to anxiety associated with new provider.  Plan: -Monitor blood pressure at home several days a week and notify me if you are at home blood pressure readings are staying higher than 130/80.      Relevant Medications   albuterol (VENTOLIN HFA) 108 (90 Base) MCG/ACT inhaler   Other Relevant Orders   Ambulatory referral to Rheumatology   Vitamin B12 (Completed)   Iron, TIBC and Ferritin Panel (Completed)   CBC with Differential/Platelet (Completed)   Reticulocytes (Completed)   PCOS (polycystic ovarian syndrome)    Kelly Ellison has a history of PCOS.  She is currently on oral birth control and is experiencing regular menstrual cycles.  She reports no additional symptoms at this time. Plan: -Continue  with oral birth control pills      Relevant Medications   albuterol (VENTOLIN HFA) 108 (90 Base) MCG/ACT inhaler   Other Relevant Orders   Ambulatory referral to Rheumatology   Vitamin B12 (Completed)   Iron, TIBC and Ferritin Panel (Completed)   CBC with Differential/Platelet (Completed)   Reticulocytes (Completed)   Elevated TSH    Kelly Ellison has a history of elevated TSH on previous labs with former provider. She has a concern for the possibility of thyroid disease today given the family history.  Plan: - We will monitor thyroid levels today and add antibodies to monitor for Hashimoto's - Rheumatology referral has been placed.They will evaluate the record and determine if the patient can be seen.       Relevant Medications   albuterol (VENTOLIN HFA) 108 (90 Base) MCG/ACT inhaler   Other Relevant Orders   Ambulatory referral to Rheumatology   Vitamin B12 (Completed)   Iron, TIBC and Ferritin Panel (Completed)   CBC with Differential/Platelet (Completed)   Reticulocytes (Completed)   TSH+T4F+T3Free+ThyAbs+TPO+VD25 (Completed)   Rash and nonspecific skin eruption    Kelly Ellison is experiencing skin rash and irritation that is worse with sunlight exposure. This, coupled with a  positive ANA has raised her concern for possible SLE. No concerning symptoms are present at this time.  Plan: - Keep your arms, neck, and face covered when in the sun to avoid exacerbation of rash - A referral has been send to rheumatology. They will evaluate your record and determine if they are able to take you as a patient.  - We will obtain a new ANA today.       Relevant Medications   albuterol (VENTOLIN HFA) 108 (90 Base) MCG/ACT inhaler   Other Relevant Orders   Ambulatory referral to Rheumatology   Vitamin B12 (Completed)   Iron, TIBC and Ferritin Panel (Completed)   CBC with Differential/Platelet (Completed)   Reticulocytes (Completed)   Positive ANA (antinuclear antibody)    History of positive ANA with previous provider coupled with rash has presented concerns for the patient over the possibility of SLE. She appears to have limited symptoms, but we will plan to repeat labs today.       BMI 37.0-37.9, adult    Kelly Ellison is currently attending a bariatric clinic and has made progress in weight loss.  At this time there are no alarm symptoms present.  We will continue to monitor closely.  Diet and exercise recommendations provided.      Other Visit Diagnoses     Vitamin D deficiency       Relevant Medications   Cholecalciferol (VITAMIN D3) 1.25 MG (50000 UT) TABS        Return in about 1 year (around 10/13/2023) for CPE.      Inice Sanluis, Sung Amabile, NP, DNP, AGNP-C [facility]

## 2022-10-14 LAB — TSH+T4F+T3FREE+THYABS+TPO+VD25
Free T4: 1.31 ng/dL (ref 0.82–1.77)
T3, Free: 3.2 pg/mL (ref 2.0–4.4)
TSH: 1.06 u[IU]/mL (ref 0.450–4.500)
Thyroglobulin Antibody: <1 IU/mL (ref 0.0–0.9)
Thyroperoxidase Ab SerPl-aCnc: 10 IU/mL (ref 0–34)
Vit D, 25-Hydroxy: 15.9 ng/mL — ABNORMAL LOW (ref 30.0–100.0)

## 2022-10-14 LAB — CBC WITH DIFFERENTIAL/PLATELET
Basophils Absolute: 0 10*3/uL (ref 0.0–0.2)
EOS (ABSOLUTE): 0.2 10*3/uL (ref 0.0–0.4)
Hemoglobin: 13.3 g/dL (ref 11.1–15.9)
Immature Granulocytes: 0 %
MCH: 25.9 pg — ABNORMAL LOW (ref 26.6–33.0)
MCV: 82 fL (ref 79–97)
Monocytes Absolute: 0.4 10*3/uL (ref 0.1–0.9)
Neutrophils Absolute: 5.4 10*3/uL (ref 1.4–7.0)
RBC: 5.14 x10E6/uL (ref 3.77–5.28)
RDW: 14.9 % (ref 11.7–15.4)
WBC: 8.4 10*3/uL (ref 3.4–10.8)

## 2022-10-14 LAB — IRON,TIBC AND FERRITIN PANEL
Iron Saturation: 12 % — ABNORMAL LOW (ref 15–55)
Total Iron Binding Capacity: 403 ug/dL (ref 250–450)

## 2022-10-14 LAB — RETICULOCYTES: Retic Ct Pct: 1.1 % (ref 0.6–2.6)

## 2022-10-19 MED ORDER — CHOLECALCIFEROL 1.25 MG (50000 UT) PO CAPS
50000.0000 [IU] | ORAL_CAPSULE | ORAL | 1 refills | Status: AC
Start: 2022-10-19 — End: ?
  Filled 2022-10-19: qty 12, fill #0
  Filled 2022-10-21: qty 12, 84d supply, fill #0
  Filled 2023-01-12 – 2023-01-26 (×5): qty 12, 84d supply, fill #1

## 2022-10-20 ENCOUNTER — Other Ambulatory Visit (HOSPITAL_COMMUNITY): Payer: Self-pay

## 2022-10-20 DIAGNOSIS — R768 Other specified abnormal immunological findings in serum: Secondary | ICD-10-CM | POA: Insufficient documentation

## 2022-10-20 DIAGNOSIS — Z6837 Body mass index (BMI) 37.0-37.9, adult: Secondary | ICD-10-CM | POA: Insufficient documentation

## 2022-10-20 DIAGNOSIS — R21 Rash and other nonspecific skin eruption: Secondary | ICD-10-CM | POA: Insufficient documentation

## 2022-10-20 DIAGNOSIS — R7989 Other specified abnormal findings of blood chemistry: Secondary | ICD-10-CM | POA: Insufficient documentation

## 2022-10-20 NOTE — Assessment & Plan Note (Signed)
Kelly Ellison is experiencing skin rash and irritation that is worse with sunlight exposure. This, coupled with a positive ANA has raised her concern for possible SLE. No concerning symptoms are present at this time.  Plan: - Keep your arms, neck, and face covered when in the sun to avoid exacerbation of rash - A referral has been send to rheumatology. They will evaluate your record and determine if they are able to take you as a patient.  - We will obtain a new ANA today.

## 2022-10-20 NOTE — Assessment & Plan Note (Signed)
Kelly Ellison experiences asthma exacerbations which she controls with albuterol.  At this time she does not have her own inhaler therefore we will send this into the pharmacy for her today.  Mild wheezing is noted on exam however no shortness of breath no alarm symptoms are present.  Recommend continuation of albuterol. Plan: -Prescription for albuterol has been sent into the pharmacy. -Please let me know if your breathing appears to worsen

## 2022-10-20 NOTE — Assessment & Plan Note (Signed)
History of positive ANA with previous provider coupled with rash has presented concerns for the patient over the possibility of SLE. She appears to have limited symptoms, but we will plan to repeat labs today.

## 2022-10-20 NOTE — Assessment & Plan Note (Signed)
Kelly Ellison has a history of elevated TSH on previous labs with former provider. She has a concern for the possibility of thyroid disease today given the family history.  Plan: - We will monitor thyroid levels today and add antibodies to monitor for Hashimoto's - Rheumatology referral has been placed.They will evaluate the record and determine if the patient can be seen.

## 2022-10-20 NOTE — Assessment & Plan Note (Signed)
Blood pressure elevated during visit today. Readings in recent past have been normal. Suspect this is related to anxiety associated with new provider.  Plan: -Monitor blood pressure at home several days a week and notify me if you are at home blood pressure readings are staying higher than 130/80.

## 2022-10-20 NOTE — Assessment & Plan Note (Signed)
Kelly Ellison has a history of PCOS.  She is currently on oral birth control and is experiencing regular menstrual cycles.  She reports no additional symptoms at this time. Plan: -Continue with oral birth control pills

## 2022-10-20 NOTE — Assessment & Plan Note (Signed)
Evi is currently attending a bariatric clinic and has made progress in weight loss.  At this time there are no alarm symptoms present.  We will continue to monitor closely.  Diet and exercise recommendations provided.

## 2022-10-21 ENCOUNTER — Other Ambulatory Visit (HOSPITAL_COMMUNITY): Payer: Self-pay

## 2022-11-01 ENCOUNTER — Other Ambulatory Visit (HOSPITAL_COMMUNITY): Payer: Self-pay

## 2022-11-04 ENCOUNTER — Encounter: Payer: Self-pay | Admitting: Nurse Practitioner

## 2022-11-29 ENCOUNTER — Other Ambulatory Visit: Payer: Self-pay

## 2023-01-12 ENCOUNTER — Other Ambulatory Visit: Payer: Self-pay

## 2023-01-12 ENCOUNTER — Other Ambulatory Visit (HOSPITAL_COMMUNITY): Payer: Self-pay

## 2023-01-17 ENCOUNTER — Other Ambulatory Visit: Payer: Self-pay

## 2023-01-20 ENCOUNTER — Other Ambulatory Visit: Payer: Self-pay

## 2023-01-20 ENCOUNTER — Other Ambulatory Visit (HOSPITAL_COMMUNITY): Payer: Self-pay

## 2023-01-23 ENCOUNTER — Encounter: Payer: Self-pay | Admitting: Pharmacist

## 2023-01-23 ENCOUNTER — Other Ambulatory Visit: Payer: Self-pay

## 2023-01-26 ENCOUNTER — Other Ambulatory Visit (HOSPITAL_COMMUNITY): Payer: Self-pay

## 2023-01-26 ENCOUNTER — Other Ambulatory Visit: Payer: Self-pay

## 2023-03-21 ENCOUNTER — Other Ambulatory Visit (HOSPITAL_COMMUNITY): Payer: Self-pay

## 2023-03-22 ENCOUNTER — Other Ambulatory Visit (HOSPITAL_COMMUNITY): Payer: Self-pay

## 2023-03-28 ENCOUNTER — Encounter: Payer: Self-pay | Admitting: Nurse Practitioner

## 2023-03-28 DIAGNOSIS — T753XXA Motion sickness, initial encounter: Secondary | ICD-10-CM

## 2023-03-29 MED ORDER — ONDANSETRON 8 MG PO TBDP
4.0000 mg | ORAL_TABLET | Freq: Three times a day (TID) | ORAL | 1 refills | Status: AC | PRN
Start: 2023-03-29 — End: ?

## 2023-03-29 MED ORDER — SCOPOLAMINE 1 MG/3DAYS TD PT72SCOPOLAMINE 1 MG/3DAYS
1.0000 | MEDICATED_PATCH | TRANSDERMAL | 1 refills | Status: AC
Start: 2023-03-29 — End: ?

## 2023-05-11 ENCOUNTER — Other Ambulatory Visit (HOSPITAL_COMMUNITY): Payer: Self-pay

## 2023-05-11 MED ORDER — TRIAMCINOLONE ACETONIDE 0.1 % EX OINT
1.0000 | TOPICAL_OINTMENT | Freq: Two times a day (BID) | CUTANEOUS | 1 refills | Status: AC
  Filled 2023-05-11: qty 30, 15d supply, fill #0

## 2023-05-23 ENCOUNTER — Other Ambulatory Visit (HOSPITAL_COMMUNITY): Payer: Self-pay

## 2023-09-26 ENCOUNTER — Other Ambulatory Visit (HOSPITAL_COMMUNITY): Payer: Self-pay

## 2023-09-27 ENCOUNTER — Other Ambulatory Visit (HOSPITAL_COMMUNITY): Payer: Self-pay

## 2023-09-27 ENCOUNTER — Other Ambulatory Visit: Payer: Self-pay

## 2023-09-27 MED ORDER — LEVONORGESTREL-ETHINYL ESTRAD 0.1-20 MG-MCG PO TABS
ORAL_TABLET | ORAL | 0 refills | Status: DC
  Filled 2023-09-27 – 2023-10-02 (×2): qty 28, 28d supply, fill #0

## 2023-10-02 ENCOUNTER — Encounter (HOSPITAL_COMMUNITY): Payer: Self-pay

## 2023-10-02 ENCOUNTER — Other Ambulatory Visit (HOSPITAL_COMMUNITY): Payer: Self-pay

## 2023-10-19 ENCOUNTER — Encounter: Payer: 59 | Admitting: Nurse Practitioner

## 2023-10-26 ENCOUNTER — Other Ambulatory Visit (HOSPITAL_COMMUNITY): Payer: Self-pay

## 2023-10-26 ENCOUNTER — Encounter (HOSPITAL_COMMUNITY): Payer: Self-pay

## 2023-10-26 MED ORDER — LEVONORGESTREL-ETHINYL ESTRAD 0.1-20 MG-MCG PO TABS
1.0000 | ORAL_TABLET | Freq: Every day | ORAL | 4 refills | Status: AC
  Filled 2023-10-26 – 2023-10-27 (×2): qty 84, 84d supply, fill #0
  Filled 2024-01-14: qty 84, 84d supply, fill #1
  Filled 2024-04-09: qty 84, 84d supply, fill #2
  Filled 2024-06-30: qty 84, 84d supply, fill #3

## 2023-10-26 MED ORDER — METFORMIN HCL 500 MG PO TABS
500.0000 mg | ORAL_TABLET | Freq: Two times a day (BID) | ORAL | 11 refills | Status: AC
  Filled 2023-10-26 – 2023-10-27 (×2): qty 60, 30d supply, fill #0
  Filled 2023-12-09: qty 60, 30d supply, fill #1
  Filled 2024-01-10: qty 60, 30d supply, fill #2
  Filled 2024-02-19: qty 60, 30d supply, fill #3
  Filled 2024-03-25: qty 60, 30d supply, fill #4
  Filled 2024-05-01: qty 60, 30d supply, fill #5
  Filled 2024-06-04: qty 60, 30d supply, fill #6
  Filled 2024-07-17: qty 60, 30d supply, fill #7

## 2023-10-27 ENCOUNTER — Other Ambulatory Visit (HOSPITAL_COMMUNITY): Payer: Self-pay

## 2023-12-09 ENCOUNTER — Other Ambulatory Visit (HOSPITAL_COMMUNITY): Payer: Self-pay

## 2024-01-10 ENCOUNTER — Other Ambulatory Visit: Payer: Self-pay

## 2024-01-15 ENCOUNTER — Other Ambulatory Visit (HOSPITAL_COMMUNITY): Payer: Self-pay

## 2024-02-03 ENCOUNTER — Encounter: Payer: Self-pay | Admitting: Nurse Practitioner

## 2024-02-05 NOTE — Telephone Encounter (Signed)
 Left voicemail and sent MyChart message to call to schedule appointment

## 2024-02-20 ENCOUNTER — Other Ambulatory Visit (HOSPITAL_COMMUNITY): Payer: Self-pay

## 2024-03-26 ENCOUNTER — Other Ambulatory Visit (HOSPITAL_COMMUNITY): Payer: Self-pay

## 2024-04-09 ENCOUNTER — Other Ambulatory Visit (HOSPITAL_COMMUNITY): Payer: Self-pay

## 2024-04-09 ENCOUNTER — Encounter: Payer: Self-pay | Admitting: Nurse Practitioner

## 2024-05-01 ENCOUNTER — Other Ambulatory Visit (HOSPITAL_COMMUNITY): Payer: Self-pay

## 2024-06-04 ENCOUNTER — Other Ambulatory Visit (HOSPITAL_COMMUNITY): Payer: Self-pay

## 2024-07-01 ENCOUNTER — Other Ambulatory Visit (HOSPITAL_COMMUNITY): Payer: Self-pay

## 2024-07-18 ENCOUNTER — Other Ambulatory Visit: Payer: Self-pay

## 2024-07-19 ENCOUNTER — Other Ambulatory Visit: Payer: Self-pay

## 2024-07-19 ENCOUNTER — Other Ambulatory Visit (HOSPITAL_COMMUNITY): Payer: Self-pay

## 2024-07-19 ENCOUNTER — Encounter: Payer: Self-pay | Admitting: Pharmacist
